# Patient Record
Sex: Female | Born: 1965 | Race: Black or African American | Marital: Single | Smoking: Never smoker
Health system: Northeastern US, Community
[De-identification: ages and names within clinical notes are randomized; demographics above are authoritative.]

## PROBLEM LIST (undated history)

## (undated) DIAGNOSIS — N289 Disorder of kidney and ureter, unspecified: Secondary | ICD-10-CM

## (undated) DIAGNOSIS — G629 Polyneuropathy, unspecified: Secondary | ICD-10-CM

## (undated) DIAGNOSIS — D869 Sarcoidosis, unspecified: Secondary | ICD-10-CM

## (undated) DIAGNOSIS — I10 Essential (primary) hypertension: Secondary | ICD-10-CM

## (undated) DIAGNOSIS — I219 Acute myocardial infarction, unspecified: Secondary | ICD-10-CM

## (undated) DIAGNOSIS — D860 Sarcoidosis of lung: Secondary | ICD-10-CM

## (undated) HISTORY — PX: TUBAL LIGATION: SHX77

## (undated) HISTORY — PX: KNEE SURGERY: SHX244

## (undated) HISTORY — PX: OTHER SURGICAL HISTORY: SHX169

## (undated) HISTORY — PX: CHOLECYSTECTOMY: SHX55

---
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
## 2015-12-06 ENCOUNTER — Emergency Department: Admit: 2015-12-07 | Payer: MEDICARE

## 2015-12-06 DIAGNOSIS — R079 Chest pain, unspecified: Secondary | ICD-10-CM
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
## 2015-12-06 NOTE — ED Triage Notes (Signed)
Pt arrives via EMS for c/o chest pain and sob. States started early this morning. Pt also c/o dizziness and nausea. States right kidney pain and states passed blood clot this morning. Pt received 324 Asprin in prior to ems arrival. Pt with port that she would like to be accessed. States port is for iron infusions.

## 2015-12-07 ENCOUNTER — Inpatient Hospital Stay
Admit: 2015-12-07 | Discharge: 2015-12-07 | Disposition: A | Discharge status: Home / Self Care | Payer: MEDICARE | Attending: Emergency Medicine

## 2015-12-07 LAB — EKG 12-LEAD
Atrial Rate: 78 {beats}/min
Diagnosis: NORMAL
P Axis: 69 degrees
P-R Interval: 188 ms
Q-T Interval: 354 ms
QRS Duration: 98 ms
QTc Calculation (Bazett): 403 ms
R Axis: 40 degrees
T Axis: 5 degrees
Ventricular Rate: 78 {beats}/min

## 2015-12-07 LAB — CBC WITH AUTOMATED DIFF
ABS. BASOPHILS: 0 10*3/uL (ref 0.0–0.2)
ABS. EOSINOPHILS: 0.4 10*3/uL (ref 0.0–0.8)
ABS. IMM. GRANS.: 0 10*3/uL (ref 0.0–0.5)
ABS. LYMPHOCYTES: 4.2 10*3/uL (ref 0.5–4.6)
ABS. MONOCYTES: 0.4 10*3/uL (ref 0.1–1.3)
ABS. NEUTROPHILS: 2.5 10*3/uL (ref 1.7–8.2)
BASOPHILS: 0 % (ref 0.0–2.0)
EOSINOPHILS: 5 % (ref 0.5–7.8)
HCT: 34.7 % — ABNORMAL LOW (ref 35.8–46.3)
HGB: 11.5 g/dL — ABNORMAL LOW (ref 11.7–15.4)
IMMATURE GRANULOCYTES: 0.1 % (ref 0.0–5.0)
LYMPHOCYTES: 56 % — ABNORMAL HIGH (ref 13–44)
MCH: 27.5 PG (ref 26.1–32.9)
MCHC: 33.1 g/dL (ref 31.4–35.0)
MCV: 83 FL (ref 79.6–97.8)
MONOCYTES: 6 % (ref 4.0–12.0)
MPV: 10.5 FL — ABNORMAL LOW (ref 10.8–14.1)
NEUTROPHILS: 33 % — ABNORMAL LOW (ref 43–78)
PLATELET: 192 10*3/uL (ref 150–450)
RBC: 4.18 M/uL (ref 4.05–5.25)
RDW: 14 % (ref 11.9–14.6)
WBC: 7.6 10*3/uL (ref 4.3–11.1)

## 2015-12-07 LAB — EKG, 12 LEAD, INITIAL
Atrial Rate: 78 {beats}/min
Calculated P Axis: 69 degrees
Calculated R Axis: 40 degrees
Calculated T Axis: 5 degrees
Diagnosis: NORMAL
P-R Interval: 188 ms
Q-T Interval: 354 ms
QRS Duration: 98 ms
QTC Calculation (Bezet): 403 ms
Ventricular Rate: 78 {beats}/min

## 2015-12-07 LAB — URINE MICROSCOPIC

## 2015-12-07 LAB — METABOLIC PANEL, COMPREHENSIVE
A-G Ratio: 0.9 — ABNORMAL LOW (ref 1.2–3.5)
ALT (SGPT): 36 U/L (ref 12–65)
AST (SGOT): 26 U/L (ref 15–37)
Albumin: 3.5 g/dL (ref 3.5–5.0)
Alk. phosphatase: 107 U/L (ref 50–136)
Anion gap: 8 mmol/L (ref 7–16)
BUN: 18 MG/DL (ref 6–23)
Bilirubin, total: 0.2 MG/DL (ref 0.2–1.1)
CO2: 26 mmol/L (ref 21–32)
Calcium: 8.9 MG/DL (ref 8.3–10.4)
Chloride: 107 mmol/L (ref 98–107)
Creatinine: 1.4 MG/DL — ABNORMAL HIGH (ref 0.6–1.0)
GFR est AA: 51 mL/min/{1.73_m2} — ABNORMAL LOW (ref >60)
GFR est non-AA: 42 mL/min/{1.73_m2} — ABNORMAL LOW (ref >60)
Globulin: 3.7 g/dL — ABNORMAL HIGH (ref 2.3–3.5)
Glucose: 132 mg/dL — ABNORMAL HIGH (ref 65–100)
Potassium: 3.6 mmol/L (ref 3.5–5.1)
Protein, total: 7.2 g/dL (ref 6.3–8.2)
Sodium: 141 mmol/L (ref 136–145)

## 2015-12-07 LAB — POC TROPONIN: Troponin-I (POC): 0 ng/ml (ref 0.0–0.08)

## 2015-12-07 MED ORDER — HEPARIN, PORCINE (PF) 100 UNIT/ML IV SYRINGE
100 unit/mL | INTRAVENOUS | Status: DC | PRN
Start: 2015-12-07 — End: 2015-12-07
  Administered 2015-12-07: 06:00:00

## 2015-12-07 MED ORDER — ONDANSETRON (PF) 4 MG/2 ML INJECTION
4 mg/2 mL | INTRAMUSCULAR | Status: AC
Start: 2015-12-07 — End: 2015-12-07
  Administered 2015-12-07: 05:00:00 via INTRAVENOUS

## 2015-12-07 MED ORDER — HYDROMORPHONE (PF) 1 MG/ML IJ SOLN
1 mg/mL | INTRAMUSCULAR | Status: AC
Start: 2015-12-07 — End: 2015-12-07
  Administered 2015-12-07: 05:00:00 via INTRAVENOUS

## 2015-12-07 MED FILL — ONDANSETRON (PF) 4 MG/2 ML INJECTION: 4 mg/2 mL | INTRAMUSCULAR | Qty: 2

## 2015-12-07 MED FILL — MONOJECT PREFILL ADVANCED (PF) 100 UNIT/ML INTRAVENOUS SYRINGE: 100 unit/mL | INTRAVENOUS | Qty: 3

## 2015-12-07 MED FILL — HYDROMORPHONE (PF) 1 MG/ML IJ SOLN: 1 mg/mL | INTRAMUSCULAR | Qty: 1

## 2015-12-07 NOTE — ED Notes (Signed)
Pt discharged instructions given pt v\\u to instructions. Pt ambulatory on discharge in no acute distress. Pt getting ride back to shelter

## 2016-08-05 HISTORY — PX: KNEE ARTHROSCOPY W/ MENISCAL REPAIR: SHX1877

## 2016-08-09 ENCOUNTER — Observation Stay
Admission: EM | Admit: 2016-08-09 | Discharge: 2016-08-12 | Disposition: A | Discharge status: Home / Self Care | Payer: Medicare Other | Attending: Internal Medicine | Admitting: Internal Medicine

## 2016-08-09 ENCOUNTER — Emergency Department: Payer: Medicare Other

## 2016-08-09 DIAGNOSIS — R0789 Other chest pain: Secondary | ICD-10-CM | POA: Diagnosis not present

## 2016-08-09 DIAGNOSIS — Z8249 Family history of ischemic heart disease and other diseases of the circulatory system: Secondary | ICD-10-CM | POA: Insufficient documentation

## 2016-08-09 DIAGNOSIS — I252 Old myocardial infarction: Secondary | ICD-10-CM | POA: Diagnosis not present

## 2016-08-09 DIAGNOSIS — Z7982 Long term (current) use of aspirin: Secondary | ICD-10-CM | POA: Diagnosis not present

## 2016-08-09 DIAGNOSIS — I10 Essential (primary) hypertension: Secondary | ICD-10-CM | POA: Insufficient documentation

## 2016-08-09 DIAGNOSIS — R079 Chest pain, unspecified: Secondary | ICD-10-CM | POA: Diagnosis present

## 2016-08-09 DIAGNOSIS — I2510 Atherosclerotic heart disease of native coronary artery without angina pectoris: Secondary | ICD-10-CM | POA: Diagnosis not present

## 2016-08-09 DIAGNOSIS — Z79899 Other long term (current) drug therapy: Secondary | ICD-10-CM | POA: Insufficient documentation

## 2016-08-09 DIAGNOSIS — E785 Hyperlipidemia, unspecified: Secondary | ICD-10-CM | POA: Diagnosis not present

## 2016-08-09 DIAGNOSIS — Z955 Presence of coronary angioplasty implant and graft: Secondary | ICD-10-CM | POA: Diagnosis not present

## 2016-08-09 DIAGNOSIS — D649 Anemia, unspecified: Secondary | ICD-10-CM | POA: Diagnosis not present

## 2016-08-09 DIAGNOSIS — Z885 Allergy status to narcotic agent status: Secondary | ICD-10-CM | POA: Diagnosis not present

## 2016-08-09 DIAGNOSIS — Z79891 Long term (current) use of opiate analgesic: Secondary | ICD-10-CM | POA: Insufficient documentation

## 2016-08-09 HISTORY — DX: Disorder of kidney and ureter, unspecified: N28.9

## 2016-08-09 HISTORY — DX: Acute myocardial infarction, unspecified: I21.9

## 2016-08-09 HISTORY — DX: Polyneuropathy, unspecified: G62.9

## 2016-08-09 HISTORY — DX: Essential (primary) hypertension: I10

## 2016-08-09 HISTORY — DX: Sarcoidosis, unspecified: D86.9

## 2016-08-09 MED ORDER — HYDROMORPHONE HCL 1 MG/ML IJ SOLN
1.0000 mg | Freq: Once | INTRAMUSCULAR | Status: AC
  Administered 2016-08-10: 1 mg via INTRAVENOUS
  Filled 2016-08-09: qty 1

## 2016-08-09 MED ORDER — ONDANSETRON HCL 4 MG/2ML IJ SOLN
4.0000 mg | Freq: Once | INTRAMUSCULAR | Status: AC
  Administered 2016-08-10: 4 mg via INTRAVENOUS
  Filled 2016-08-09: qty 2

## 2016-08-09 NOTE — ED Provider Notes (Signed)
[facility] Emergency Department Provider Note   First MD Initiated Contact with Patient 08/09/16 2256     (approximate)  I have reviewed the triage vital signs and the nursing notes.   HISTORY  Chief Complaint Chest Pain; Dizziness; and Nausea    HPI Melissa Haas is a 51 y.o. female with bolus of chronic medical conditions including myocardial infarction in 2009 [facility], sarcoidosis, stage III renal disease and recent left knee meniscal repair 5 days ago presents to the emergency department with acute onset of left chest pain with radiation to the left neck and arm approximately an hour and a half ago accompanied by shortness of breath. Patient states her current pain score is 9 out of 10. Patient denies any aggravating or alleviating factors. Patient was given 324 mg of aspirin while in route.   Past Medical History:  Diagnosis Date  . Hypertension   . MI (myocardial infarction)   . Neuropathy (HCC)   . Renal disorder   . Sarcoidosis [facility](HCC)     Patient Active Problem List   Diagnosis Date Noted  . Atypical chest pain 08/10/2016    Past Surgical History:  Procedure Laterality Date  . CHOLECYSTECTOMY    . heart stent    . KNEE SURGERY    . TUBAL LIGATION      Prior to Admission medications   Medication Sig Start Date End Date Taking? Authorizing Provider  aspirin EC 81 MG tablet Take 81 mg by mouth daily.   Yes Historical Provider, MD  atorvastatin (LIPITOR) 80 MG tablet Take 80 mg by mouth every evening.   Yes Historical Provider, MD  chlorzoxazone (PARAFON) 500 MG tablet Take 500 mg by mouth 3 (three) times daily as needed for spasms.   Yes Historical Provider, MD  cyanocobalamin (,VITAMIN B-12,) 1000 MCG/ML injection Inject 1,000 mcg into the muscle every 30 (thirty) days.   Yes Historical Provider, MD  Fluocinolone Acetonide Body 0.01 % OIL Apply topically.   Yes Historical Provider, MD  gabapentin (NEURONTIN) 100  MG capsule Take 100 mg by mouth 2 (two) times daily.   Yes Historical Provider, MD  Oxycodone HCl 10 MG TABS Take 10 mg by mouth every 4 (four) hours as needed for pain.   Yes Historical Provider, MD  topiramate (TOPAMAX) 50 MG tablet Take 50 mg by mouth every evening.   Yes Historical Provider, MD    Allergies Flagyl [metronidazole]; Lisinopril; Morphine and related; Toradol [ketorolac tromethamine]; and Vicodin [hydrocodone-acetaminophen]  No family history on file.  Social History Social History  Substance Use Topics  . Smoking status: Never Smoker  . Smokeless tobacco: Never Used  . Alcohol use No    Review of Systems Constitutional: No fever/chills Eyes: No visual changes. ENT: No sore throat. Cardiovascular: Positive for chest pain. Respiratory: Positive for shortness of breath. Gastrointestinal: No abdominal pain.  No nausea, no vomiting.  No diarrhea.  No constipation. Genitourinary: Negative for dysuria. Musculoskeletal: Negative for back pain. Skin: Negative for rash. Neurological: Negative for headaches, focal weakness or numbness.  10-point ROS otherwise negative.  ____________________________________________   PHYSICAL EXAM:  VITAL SIGNS: ED Triage Vitals  Enc Vitals Group     BP 08/09/16 2235 119/75     Pulse Rate 08/09/16 2235 83     Resp 08/09/16 2235 18     Temp 08/09/16 2235 98.3 F (36.8 C)     Temp Source 08/09/16 2235 Oral     SpO2  08/09/16 2235 99 %     Weight 08/09/16 2222 244 lb (110.7 kg)     Height 08/09/16 2222 5\' 10"  (1.778 m)     Head Circumference --      Peak Flow --      Pain Score 08/09/16 2222 8     Pain Loc --      Pain Edu? --      Excl. in GC? --    Constitutional: Alert and oriented. Well appearing and in no acute distress. Eyes: Conjunctivae are normal. PERRL. EOMI. Head: Atraumatic. Nose: No congestion/rhinnorhea. Mouth/Throat: Mucous membranes are moist.  Oropharynx non-erythematous. Neck: No stridor.    Cardiovascular: Normal rate, regular rhythm. Good peripheral circulation. Grossly normal heart sounds. Respiratory: Normal respiratory effort.  No retractions. Lungs CTAB. Gastrointestinal: Soft and nontender. No distention.  Musculoskeletal: No lower extremity tenderness nor edema. No gross deformities of extremities.Left knee incision sites clean dry and intact without any signs of infection. 1+ on pitting edema of the left leg. Neurologic:  Normal speech and language. No gross focal neurologic deficits are appreciated.  Skin:  Skin is warm, dry and intact. No rash noted. Psychiatric: Mood and affect are normal. Speech and behavior are normal.  ____________________________________________   LABS (all labs ordered are listed, but only abnormal results are displayed)  Labs Reviewed  BASIC METABOLIC PANEL - Abnormal; Notable for the following:       Result Value   Glucose, Bld 109 (*)    BUN 21 (*)    Creatinine, Ser 1.18 (*)    GFR calc non Af Amer 53 (*)    All other components within normal limits  CBC - Abnormal; Notable for the following:    Hemoglobin 11.0 (*)    HCT 33.9 (*)    All other components within normal limits  TROPONIN I  HEPATIC FUNCTION PANEL  LIPASE, BLOOD  TROPONIN I   ____________________________________________  EKG  .GGEKG  ____________________________________________  RADIOLOGY I, Darci Current, personally viewed and evaluated these images (plain radiographs) as part of my medical decision making, as well as reviewing the written report by the radiologist.  Dg Chest 2 View  Result Date: 08/09/2016 CLINICAL DATA:  Central left-sided chest pain EXAM: CHEST  2 VIEW COMPARISON:  None. FINDINGS: The heart size and mediastinal contours are within normal limits. Both lungs are clear. No acute nor suspicious osseous abnormalities. There is straightening of thoracic spine. Cholecystectomy clips are seen in the upper abdomen. Port catheter tip terminates  in the distal SVC. IMPRESSION: No active cardiopulmonary disease. Electronically Signed   By: Tollie Eth M.D.   On: 08/09/2016 23:36   Ct Angio Chest Pe W And/or Wo Contrast  Result Date: 08/10/2016 CLINICAL DATA:  Acute onset of central and left-sided chest pain. Vomiting and shortness of breath. Left leg swelling after recent meniscal repair. Initial encounter. EXAM: CT ANGIOGRAPHY CHEST WITH CONTRAST TECHNIQUE: Multidetector CT imaging of the chest was performed using the standard protocol during bolus administration of intravenous contrast. Multiplanar CT image reconstructions and MIPs were obtained to evaluate the vascular anatomy. CONTRAST:  75 mL of Isovue 370 IV contrast COMPARISON:  Chest radiograph performed 08/09/2016 FINDINGS: Cardiovascular:  There is no evidence of pulmonary embolus. The heart is normal in size. The thoracic aorta is unremarkable. No calcific atherosclerotic disease is seen. The great vessels are grossly unremarkable. Mediastinum/Nodes: The mediastinum is unremarkable in appearance. No mediastinal lymphadenopathy is seen. No pericardial effusion is identified. The visualized portions of  the thyroid gland are unremarkable. A right-sided chest port is noted ending about the distal SVC. Lungs/Pleura: Minimal bilateral atelectasis is noted. No pleural effusion or pneumothorax is seen. No masses are identified. Upper Abdomen: The visualized portions of the liver and spleen are unremarkable. The patient is status post cholecystectomy, with clips noted at the gallbladder fossa. The visualized portions of the pancreas, adrenal glands and kidneys are within normal limits. Musculoskeletal: No acute osseous abnormalities are identified. The visualized musculature is unremarkable in appearance. Review of the MIP images confirms the above findings. IMPRESSION: 1. No evidence of pulmonary embolus. 2. Minimal bilateral atelectasis noted.  Lungs otherwise clear. Electronically Signed   By:  Roanna Raider M.D.   On: 08/10/2016 02:39     Procedures   ____________________________________________   INITIAL IMPRESSION / ASSESSMENT AND PLAN / ED COURSE  Pertinent labs & imaging results that were available during my care of the patient were reviewed by me and considered in my medical decision making (see chart for details).  Given history of physical exam concern for possible cardiac etiology versus pulmonary emboli. CT scan chest PE protocol revealed no evidence of pulmonary emboli. Regarding possible cardiac etiology troponin negative 1 EKG revealed no evidence of ischemia or infarction. Patient discussed with Dr. Frederica Kuster admission for further evaluation and management.      ____________________________________________  FINAL CLINICAL IMPRESSION(S) / ED DIAGNOSES  Final diagnoses:  Chest pain, unspecified type     MEDICATIONS GIVEN DURING THIS VISIT:  Medications  HYDROmorphone (DILAUDID) injection 1 mg (1 mg Intravenous Given 08/10/16 0006)  ondansetron (ZOFRAN) injection 4 mg (4 mg Intravenous Given 08/10/16 0003)  sodium chloride 0.9 % bolus 1,000 mL (1,000 mLs Intravenous New Bag/Given 08/10/16 0234)  iopamidol (ISOVUE-370) 76 % injection 75 mL (75 mLs Intravenous Contrast Given 08/10/16 0214)     NEW OUTPATIENT MEDICATIONS STARTED DURING THIS VISIT:  New Prescriptions   No medications on file    Modified Medications   No medications on file    Discontinued Medications   ASPIRIN EC 325 MG TABLET    Take 325 mg by mouth daily.   HYDROCODONE-ACETAMINOPHEN (NORCO/VICODIN) 5-325 MG TABLET    Take 1-2 tablets by mouth every 4 (four) hours as needed for painful procedure/intervention.   PROMETHAZINE (PHENERGAN) 25 MG TABLET    Take 25 mg by mouth every 6 (six) hours as needed for nausea.     Note:  This document was prepared using Dragon voice recognition software and may include unintentional dictation errors.    Darci Current, MD 08/10/16  [phone]

## 2016-08-09 NOTE — ED Triage Notes (Signed)
Pt reports to ED via [facility] with c/o central and left-sided chest pain that started 1 1/2 hrs PTA. Pt reports x1 emesis, (+) SHOB. Pt with significant cardiac h/x, including MI and stent placement. EMS reports giving pt 4 aspirin tablets in route. Pt denies radiation of pain into neck, back or jaw, but states her neck feels "tingly".

## 2016-08-09 NOTE — ED Notes (Signed)
Pt requests to not have blood drawn in Triage; states she has a port-a-cath and would rather that be accessed for lab testing.

## 2016-08-10 ENCOUNTER — Emergency Department: Payer: Medicare Other

## 2016-08-10 ENCOUNTER — Encounter: Payer: Self-pay | Admitting: Radiology

## 2016-08-10 DIAGNOSIS — R0789 Other chest pain: Secondary | ICD-10-CM | POA: Diagnosis present

## 2016-08-10 LAB — CBC
HCT: 33.9 % — ABNORMAL LOW (ref 35.0–47.0)
Hemoglobin: 11 g/dL — ABNORMAL LOW (ref 12.0–16.0)
MCH: 27 pg (ref 26.0–34.0)
MCHC: 32.6 g/dL (ref 32.0–36.0)
MCV: 82.9 fL (ref 80.0–100.0)
PLATELETS: 178 10*3/uL (ref 150–440)
RBC: 4.09 MIL/uL (ref 3.80–5.20)
RDW: 14.3 % (ref 11.5–14.5)
WBC: 7.6 10*3/uL (ref 3.6–11.0)

## 2016-08-10 LAB — BASIC METABOLIC PANEL
ANION GAP: 8 (ref 5–15)
BUN: 21 mg/dL — ABNORMAL HIGH (ref 6–20)
CALCIUM: 9 mg/dL (ref 8.9–10.3)
CO2: 24 mmol/L (ref 22–32)
CREATININE: 1.18 mg/dL — AB (ref 0.44–1.00)
Chloride: 108 mmol/L (ref 101–111)
GFR calc non Af Amer: 53 mL/min — ABNORMAL LOW (ref >60)
Glucose, Bld: 109 mg/dL — ABNORMAL HIGH (ref 65–99)
Potassium: 3.5 mmol/L (ref 3.5–5.1)
Sodium: 140 mmol/L (ref 135–145)

## 2016-08-10 LAB — TROPONIN I: Troponin I: <0.03 ng/mL (ref <0.03)

## 2016-08-10 LAB — HEPATIC FUNCTION PANEL
ALBUMIN: 3.8 g/dL (ref 3.5–5.0)
ALT: 27 U/L (ref 14–54)
AST: 30 U/L (ref 15–41)
Alkaline Phosphatase: 72 U/L (ref 38–126)
Bilirubin, Direct: 0.1 mg/dL (ref 0.1–0.5)
Indirect Bilirubin: 0.4 mg/dL (ref 0.3–0.9)
TOTAL PROTEIN: 6.9 g/dL (ref 6.5–8.1)
Total Bilirubin: 0.5 mg/dL (ref 0.3–1.2)

## 2016-08-10 LAB — LIPASE, BLOOD: Lipase: 22 U/L (ref 11–51)

## 2016-08-10 LAB — TSH: TSH: 1.96 u[IU]/mL (ref 0.350–4.500)

## 2016-08-10 MED ORDER — SODIUM CHLORIDE 0.9 % IV SOLN
INTRAVENOUS | Status: DC
  Administered 2016-08-10 – 2016-08-11 (×3): via INTRAVENOUS

## 2016-08-10 MED ORDER — CYANOCOBALAMIN 1000 MCG/ML IJ SOLN
1000.0000 ug | INTRAMUSCULAR | Status: DC
  Filled 2016-08-10: qty 1

## 2016-08-10 MED ORDER — CARVEDILOL 3.125 MG PO TABS
3.1250 mg | ORAL_TABLET | Freq: Two times a day (BID) | ORAL | Status: DC
  Administered 2016-08-10 – 2016-08-12 (×3): 3.125 mg via ORAL
  Filled 2016-08-10 (×2): qty 1

## 2016-08-10 MED ORDER — ASPIRIN EC 81 MG PO TBEC
81.0000 mg | DELAYED_RELEASE_TABLET | Freq: Every day | ORAL | Status: DC
  Administered 2016-08-10 – 2016-08-12 (×3): 81 mg via ORAL
  Filled 2016-08-10 (×3): qty 1

## 2016-08-10 MED ORDER — SODIUM CHLORIDE 0.9% FLUSH
10.0000 mL | Freq: Two times a day (BID) | INTRAVENOUS | Status: DC
  Administered 2016-08-10 – 2016-08-12 (×4): 10 mL

## 2016-08-10 MED ORDER — ONDANSETRON HCL 4 MG PO TABS: 4.0000 mg | ORAL_TABLET | Freq: Four times a day (QID) | ORAL | Status: DC | PRN

## 2016-08-10 MED ORDER — ONDANSETRON HCL 4 MG/2ML IJ SOLN
4.0000 mg | Freq: Four times a day (QID) | INTRAMUSCULAR | Status: DC | PRN
  Administered 2016-08-11 – 2016-08-12 (×2): 4 mg via INTRAVENOUS
  Filled 2016-08-10 (×2): qty 2

## 2016-08-10 MED ORDER — SODIUM CHLORIDE 0.9% FLUSH
3.0000 mL | Freq: Two times a day (BID) | INTRAVENOUS | Status: DC
  Administered 2016-08-10 – 2016-08-12 (×2): 3 mL via INTRAVENOUS

## 2016-08-10 MED ORDER — DOCUSATE SODIUM 100 MG PO CAPS
100.0000 mg | ORAL_CAPSULE | Freq: Two times a day (BID) | ORAL | Status: DC
  Administered 2016-08-10 – 2016-08-12 (×5): 100 mg via ORAL
  Filled 2016-08-10 (×5): qty 1

## 2016-08-10 MED ORDER — SODIUM CHLORIDE 0.9 % IV BOLUS (SEPSIS)
1000.0000 mL | Freq: Once | INTRAVENOUS | Status: AC
  Administered 2016-08-10: 1000 mL via INTRAVENOUS

## 2016-08-10 MED ORDER — ATORVASTATIN CALCIUM 20 MG PO TABS
80.0000 mg | ORAL_TABLET | Freq: Every evening | ORAL | Status: DC
  Administered 2016-08-10 – 2016-08-11 (×2): 80 mg via ORAL
  Filled 2016-08-10 (×2): qty 4

## 2016-08-10 MED ORDER — ENOXAPARIN SODIUM 40 MG/0.4ML ~~LOC~~ SOLN
40.0000 mg | SUBCUTANEOUS | Status: DC
  Administered 2016-08-10 – 2016-08-12 (×2): 40 mg via SUBCUTANEOUS
  Filled 2016-08-10 (×2): qty 0.4

## 2016-08-10 MED ORDER — ACETAMINOPHEN 650 MG RE SUPP: 650.0000 mg | Freq: Four times a day (QID) | RECTAL | Status: DC | PRN

## 2016-08-10 MED ORDER — TOPIRAMATE 25 MG PO TABS
50.0000 mg | ORAL_TABLET | Freq: Every evening | ORAL | Status: DC
Start: 2016-08-10 — End: 2016-08-12
  Administered 2016-08-10 – 2016-08-11 (×2): 50 mg via ORAL
  Filled 2016-08-10 (×3): qty 2

## 2016-08-10 MED ORDER — IOPAMIDOL (ISOVUE-370) INJECTION 76%
75.0000 mL | Freq: Once | INTRAVENOUS | Status: AC | PRN
  Administered 2016-08-10: 75 mL via INTRAVENOUS

## 2016-08-10 MED ORDER — SODIUM CHLORIDE 0.9% FLUSH: 10.0000 mL | INTRAVENOUS | Status: DC | PRN

## 2016-08-10 MED ORDER — GABAPENTIN 100 MG PO CAPS
100.0000 mg | ORAL_CAPSULE | Freq: Two times a day (BID) | ORAL | Status: DC
  Administered 2016-08-10 – 2016-08-12 (×5): 100 mg via ORAL
  Filled 2016-08-10 (×5): qty 1

## 2016-08-10 MED ORDER — ACETAMINOPHEN 325 MG PO TABS
650.0000 mg | ORAL_TABLET | Freq: Four times a day (QID) | ORAL | Status: DC | PRN
  Administered 2016-08-10: 650 mg via ORAL
  Filled 2016-08-10: qty 2

## 2016-08-10 NOTE — Plan of Care (Signed)
Problem: Pain Managment: Goal: General experience of comfort will improve Outcome: Progressing Complains of pain in knee. Recent surgery

## 2016-08-10 NOTE — Progress Notes (Signed)
HIV and troponin sent to lab. Consent signed for HIV test.

## 2016-08-10 NOTE — ED Notes (Signed)
Pt given graham crackers and ginger ale per EDP. Will continue to monitor pt.

## 2016-08-10 NOTE — Plan of Care (Signed)
Problem: Activity: Goal: Ability to tolerate increased activity will improve Outcome: Not Progressing Recent knee surgery (around 5 days ago).

## 2016-08-10 NOTE — Progress Notes (Signed)
Declined bed alarm. Patient is independent and ambulating well. Has had falls at home so she scores as a high fall risk. Patient agrees to call for help if she feels dizzy or needs help.

## 2016-08-10 NOTE — ED Notes (Signed)
Called lab about label printer not letting this RN or charge print labels. Gave the ok to place white patient label with collect time on vial.

## 2016-08-10 NOTE — Care Management Obs Status (Signed)
MEDICARE OBSERVATION STATUS NOTIFICATION   Patient Details  Name: Melissa Haas MRN: [id number] Date of Birth: 20-Aug-1965   Medicare Observation Status Notification Given:  Yes    Eber HongGreene, Dellia Donnelly R, RN 08/10/2016, 11:08 AM

## 2016-08-10 NOTE — Progress Notes (Signed)
A&O. Up with one assist. Rates pain in legs at a 9 and in chest at a 5, however patient falls asleep easily during conversation. Does not appear to be in any distress. On RA. VSS. IVF infusing via PAC. Will continue to monitor.

## 2016-08-10 NOTE — ED Notes (Signed)
Dr. Sheryle Hailiamond with pt in rm.

## 2016-08-10 NOTE — Progress Notes (Signed)
[facility] [place] [facility]   PATIENT NAME: Melissa LoboCarman Haas    MR#:  [id number]  DATE OF BIRTH:  03/06/1966  SUBJECTIVE:  CHIEF COMPLAINT:  Patient is feeling weak and tired reporting left left-sided chest pain. Denies any shortness of breath is resting comfortably, has Port-A-Cath for IV iron transfusions  REVIEW OF SYSTEMS:  CONSTITUTIONAL: No fever, fatigue or weakness.  EYES: No blurred or double vision.  EARS, NOSE, AND THROAT: No tinnitus or ear pain.  RESPIRATORY: No cough, shortness of breath, wheezing or hemoptysis.  CARDIOVASCULAR: Reporting left-sided chest pain, denies orthopnea, edema.  GASTROINTESTINAL: No nausea, vomiting, diarrhea or abdominal pain.  GENITOURINARY: No dysuria, hematuria.  ENDOCRINE: No polyuria, nocturia,  HEMATOLOGY: No anemia, easy bruising or bleeding SKIN: No rash or lesion. MUSCULOSKELETAL: No joint pain or arthritis.   NEUROLOGIC: No tingling, numbness, weakness.  PSYCHIATRY: No anxiety or depression.   DRUG ALLERGIES:   Allergies  Allergen Reactions  . Flagyl [Metronidazole] Anaphylaxis  . Lisinopril Cough  . Morphine And Related Other (See Comments)    Causes me "chest pain"  . Toradol [Ketorolac Tromethamine] Other (See Comments)    Involuntary muscle movements  . Vicodin [Hydrocodone-Acetaminophen] Nausea And Vomiting    VITALS:  Blood pressure 121/78, pulse 79, temperature 97.7 F (36.5 C), temperature source Oral, resp. rate 19, height 5\' 10"  (1.778 m), weight 110.7 kg (244 lb), SpO2 96 %.  PHYSICAL EXAMINATION:  GENERAL:  51 y.o.-year-old patient lying in the bed with no acute distress.  EYES: Pupils equal, round, reactive to light and accommodation. No scleral icterus. Extraocular muscles intact.  HEENT: Head atraumatic, normocephalic. Oropharynx and nasopharynx clear.  NECK:  Supple, no jugular venous distention. No thyroid enlargement, no tenderness.  LUNGS: Normal breath sounds bilaterally,  no wheezing, rales,rhonchi or crepitation. No use of accessory muscles of respiration.  CARDIOVASCULAR: S1, S2 normal. No murmurs, rubs, or gallops. Reproducible left-sided chest wall tenderness Port-A-Cath site is intact ABDOMEN: Soft, nontender, nondistended. Bowel sounds present. No organomegaly or mass.  EXTREMITIES: No pedal edema, cyanosis, or clubbing.  NEUROLOGIC: Cranial nerves II through XII are intact. Muscle strength 5/5 in all extremities. Sensation intact. Gait not checked.  PSYCHIATRIC: The patient is alert and oriented x 3.  SKIN: No obvious rash, lesion, or ulcer.    LABORATORY PANEL:   CBC  Recent Labs Lab 08/09/16 2350  WBC 7.6  HGB 11.0*  HCT 33.9*  PLT 178   ------------------------------------------------------------------------------------------------------------------  Chemistries   Recent Labs Lab 08/09/16 2350  NA 140  K 3.5  CL 108  CO2 24  GLUCOSE 109*  BUN 21*  CREATININE 1.18*  CALCIUM 9.0  AST 30  ALT 27  ALKPHOS 72  BILITOT 0.5   ------------------------------------------------------------------------------------------------------------------  Cardiac Enzymes  Recent Labs Lab 08/10/16 0652  TROPONINI <0.03   ------------------------------------------------------------------------------------------------------------------  RADIOLOGY:  Dg Chest 2 View  Result Date: 08/09/2016 CLINICAL DATA:  Central left-sided chest pain EXAM: CHEST  2 VIEW COMPARISON:  None. FINDINGS: The heart size and mediastinal contours are within normal limits. Both lungs are clear. No acute nor suspicious osseous abnormalities. There is straightening of thoracic spine. Cholecystectomy clips are seen in the upper abdomen. Port catheter tip terminates in the distal SVC. IMPRESSION: No active cardiopulmonary disease. Electronically Signed   By: Tollie Ethavid  Kwon M.D.   On: 08/09/2016 23:36   Ct Angio Chest Pe W And/or Wo Contrast  Result Date: 08/10/2016 CLINICAL  DATA:  Acute onset of central and left-sided chest  pain. Vomiting and shortness of breath. Left leg swelling after recent meniscal repair. Initial encounter. EXAM: CT ANGIOGRAPHY CHEST WITH CONTRAST TECHNIQUE: Multidetector CT imaging of the chest was performed using the standard protocol during bolus administration of intravenous contrast. Multiplanar CT image reconstructions and MIPs were obtained to evaluate the vascular anatomy. CONTRAST:  75 mL of Isovue 370 IV contrast COMPARISON:  Chest radiograph performed 08/09/2016 FINDINGS: Cardiovascular:  There is no evidence of pulmonary embolus. The heart is normal in size. The thoracic aorta is unremarkable. No calcific atherosclerotic disease is seen. The great vessels are grossly unremarkable. Mediastinum/Nodes: The mediastinum is unremarkable in appearance. No mediastinal lymphadenopathy is seen. No pericardial effusion is identified. The visualized portions of the thyroid gland are unremarkable. A right-sided chest port is noted ending about the distal SVC. Lungs/Pleura: Minimal bilateral atelectasis is noted. No pleural effusion or pneumothorax is seen. No masses are identified. Upper Abdomen: The visualized portions of the liver and spleen are unremarkable. The patient is status post cholecystectomy, with clips noted at the gallbladder fossa. The visualized portions of the pancreas, adrenal glands and kidneys are within normal limits. Musculoskeletal: No acute osseous abnormalities are identified. The visualized musculature is unremarkable in appearance. Review of the MIP images confirms the above findings. IMPRESSION: 1. No evidence of pulmonary embolus. 2. Minimal bilateral atelectasis noted.  Lungs otherwise clear. Electronically Signed   By: Roanna Raider M.D.   On: 08/10/2016 02:39    EKG:   Orders placed or performed during the hospital encounter of 08/09/16  . ED EKG within 10 minutes  . ED EKG within 10 minutes  . EKG 12-Lead  . EKG 12-Lead     ASSESSMENT AND PLAN:   This is a 51 year old female admitted for chest pain.  1. Chest pain: Atypical Acute MI ruled out with negative troponins CT and a gram of the chest is negative for pulmonary embolism Cardiology is recommending cardiac catheterization tomorrow nothing by mouth after midnight  EKG shows downgoing ST segments and inferior leads. Follow-up with [facility] cardiology Dr. Juliann Pares Continue home medication Coreg and hold off on losartan as blood pressure is soft  2. Coronary artery disease: Continue aspirin. Continue Coreg holding losartan  3. Anemia: The patient receives regular IV iron infusions. Hemoglobin is acceptable at this time and setting of coronary artery disease.  4. Hyperlipidemia: Continue statin therapy  5. DVT prophylaxis: Lovenox  6. GI prophylaxis: None     All the records are reviewed and case discussed with Care Management/Social Workerr. Management plans discussed with the patient, family and they are in agreement.  CODE STATUS:   TOTAL TIME TAKING CARE OF THIS PATIENT: 35 minutes.   POSSIBLE D/C IN AM  DAYS, DEPENDING ON CLINICAL CONDITION.  Note: This dictation was prepared with Dragon dictation along with smaller phrase technology. Any transcriptional errors that result from this process are unintentional.   Ramonita Lab M.D on 08/10/2016 at 3:07 PM  Between 7am to 6pm - Pager - [phone] After 6pm go to [email or web address] - password [email or web address] [facility]  [facility]  Office  [phone]  CC: Primary care physician; Danae Chen, MD

## 2016-08-10 NOTE — ED Notes (Addendum)
Pt reports pain in chest has decreased since medication adm (see MAR), pt is resting and has been sleeping when rounding on pt. Pt in NAD at this time.

## 2016-08-10 NOTE — ED Notes (Signed)
Lab contacted about troponin not being resulted. Lab stated it would be 20 minutes until it was ready.

## 2016-08-10 NOTE — H&P (Signed)
Melissa Haas is an 51 y.o. female.   Chief Complaint: Chest pain HPI: The patient with past medical history of coronary artery disease status post MI with stent placement, hypertension and sarcoidosis presents to the emergency department complaining of chest pain. The pain began at rest and was like pressure in character over her left chest. It radiated into her neck and down her left arm. The patient complains of her left hand becoming tingly. She had associated dizziness as well as nausea and vomiting. Emesis was nonbloody and nonbilious. She received aspirin in route in her chest pain resolved. Notably, the patient underwent repair of her left knee meniscus 5 days ago. CTA of her chest in the emergency department was negative for pulmonary embolism. Two sets of troponins were negative as well but due to her risk factors emergency department staff called the hospitalist service for admission  Past Medical History:  Diagnosis Date  . Hypertension   . MI (myocardial infarction)   . Neuropathy (HCC)   . Renal disorder   . Sarcoidosis (HCC)     Past Surgical History:  Procedure Laterality Date  . CHOLECYSTECTOMY    . heart stent    . KNEE ARTHROSCOPY W/ MENISCAL REPAIR Left 08/05/2016  . KNEE SURGERY    . TUBAL LIGATION      Family History  Problem Relation Age of Onset  . Diabetes Mellitus II Mother   . CAD Sister     d. 54 yrs old  . Diabetes Mellitus II Sister    Social History:  reports that she has never smoked. She has never used smokeless tobacco. She reports that she does not drink alcohol or use drugs.  Allergies:  Allergies  Allergen Reactions  . Flagyl [Metronidazole] Anaphylaxis  . Lisinopril Cough  . Morphine And Related Other (See Comments)    Causes me "chest pain"  . Toradol [Ketorolac Tromethamine] Other (See Comments)    Involuntary muscle movements  . Vicodin [Hydrocodone-Acetaminophen] Nausea And Vomiting    Medications Prior to Admission  Medication Sig  Dispense Refill  . aspirin EC 81 MG tablet Take 81 mg by mouth daily.    . atorvastatin (LIPITOR) 80 MG tablet Take 80 mg by mouth every evening.    . chlorzoxazone (PARAFON) 500 MG tablet Take 500 mg by mouth 3 (three) times daily as needed for spasms.    . cyanocobalamin (,VITAMIN B-12,) 1000 MCG/ML injection Inject 1,000 mcg into the muscle every 30 (thirty) days.    . Fluocinolone Acetonide Body 0.01 % OIL Apply topically.    . gabapentin (NEURONTIN) 100 MG capsule Take 100 mg by mouth 2 (two) times daily.    . Oxycodone HCl 10 MG TABS Take 10 mg by mouth every 4 (four) hours as needed for pain.    . topiramate (TOPAMAX) 50 MG tablet Take 50 mg by mouth every evening.      Results for orders placed or performed during the hospital encounter of 08/09/16 (from the past 48 hour(s))  Basic metabolic panel     Status: Abnormal   Collection Time: 08/09/16 11:50 PM  Result Value Ref Range   Sodium 140 135 - 145 mmol/L   Potassium 3.5 3.5 - 5.1 mmol/L   Chloride 108 101 - 111 mmol/L   CO2 24 22 - 32 mmol/L   Glucose, Bld 109 (H) 65 - 99 mg/dL   BUN 21 (H) 6 - 20 mg/dL   Creatinine, Ser 1.18 (H) 0.44 - 1.00 mg/dL     Calcium 9.0 8.9 - 10.3 mg/dL   GFR calc non Af Amer 53 (L) >60 mL/min   GFR calc Af Amer >60 >60 mL/min    Comment: (NOTE) The eGFR has been calculated using the CKD EPI equation. This calculation has not been validated in all clinical situations. eGFR's persistently <60 mL/min signify possible Chronic Kidney Disease.    Anion gap 8 5 - 15  CBC     Status: Abnormal   Collection Time: 08/09/16 11:50 PM  Result Value Ref Range   WBC 7.6 3.6 - 11.0 K/uL   RBC 4.09 3.80 - 5.20 MIL/uL   Hemoglobin 11.0 (L) 12.0 - 16.0 g/dL   HCT 33.9 (L) 35.0 - 47.0 %   MCV 82.9 80.0 - 100.0 fL   MCH 27.0 26.0 - 34.0 pg   MCHC 32.6 32.0 - 36.0 g/dL   RDW 14.3 11.5 - 14.5 %   Platelets 178 150 - 440 K/uL  Troponin I     Status: None   Collection Time: 08/09/16 11:50 PM  Result Value Ref  Range   Troponin I <0.03 <0.03 ng/mL  Hepatic function panel     Status: None   Collection Time: 08/09/16 11:50 PM  Result Value Ref Range   Total Protein 6.9 6.5 - 8.1 g/dL   Albumin 3.8 3.5 - 5.0 g/dL   AST 30 15 - 41 U/L   ALT 27 14 - 54 U/L   Alkaline Phosphatase 72 38 - 126 U/L   Total Bilirubin 0.5 0.3 - 1.2 mg/dL   Bilirubin, Direct 0.1 0.1 - 0.5 mg/dL   Indirect Bilirubin 0.4 0.3 - 0.9 mg/dL  Lipase, blood     Status: None   Collection Time: 08/09/16 11:50 PM  Result Value Ref Range   Lipase 22 11 - 51 U/L  Troponin I     Status: None   Collection Time: 08/10/16  3:47 AM  Result Value Ref Range   Troponin I <0.03 <0.03 ng/mL   Dg Chest 2 View  Result Date: 08/09/2016 CLINICAL DATA:  Central left-sided chest pain EXAM: CHEST  2 VIEW COMPARISON:  None. FINDINGS: The heart size and mediastinal contours are within normal limits. Both lungs are clear. No acute nor suspicious osseous abnormalities. There is straightening of thoracic spine. Cholecystectomy clips are seen in the upper abdomen. Port catheter tip terminates in the distal SVC. IMPRESSION: No active cardiopulmonary disease. Electronically Signed   By: Ashley Royalty M.D.   On: 08/09/2016 23:36   Ct Angio Chest Pe W And/or Wo Contrast  Result Date: 08/10/2016 CLINICAL DATA:  Acute onset of central and left-sided chest pain. Vomiting and shortness of breath. Left leg swelling after recent meniscal repair. Initial encounter. EXAM: CT ANGIOGRAPHY CHEST WITH CONTRAST TECHNIQUE: Multidetector CT imaging of the chest was performed using the standard protocol during bolus administration of intravenous contrast. Multiplanar CT image reconstructions and MIPs were obtained to evaluate the vascular anatomy. CONTRAST:  75 mL of Isovue 370 IV contrast COMPARISON:  Chest radiograph performed 08/09/2016 FINDINGS: Cardiovascular:  There is no evidence of pulmonary embolus. The heart is normal in size. The thoracic aorta is unremarkable. No  calcific atherosclerotic disease is seen. The great vessels are grossly unremarkable. Mediastinum/Nodes: The mediastinum is unremarkable in appearance. No mediastinal lymphadenopathy is seen. No pericardial effusion is identified. The visualized portions of the thyroid gland are unremarkable. A right-sided chest port is noted ending about the distal SVC. Lungs/Pleura: Minimal bilateral atelectasis is noted. No pleural effusion  or pneumothorax is seen. No masses are identified. Upper Abdomen: The visualized portions of the liver and spleen are unremarkable. The patient is status post cholecystectomy, with clips noted at the gallbladder fossa. The visualized portions of the pancreas, adrenal glands and kidneys are within normal limits. Musculoskeletal: No acute osseous abnormalities are identified. The visualized musculature is unremarkable in appearance. Review of the MIP images confirms the above findings. IMPRESSION: 1. No evidence of pulmonary embolus. 2. Minimal bilateral atelectasis noted.  Lungs otherwise clear. Electronically Signed   By: Jeffery  Chang M.D.   On: 08/10/2016 02:39    Review of Systems  Constitutional: Negative for chills and fever.  HENT: Negative for sore throat and tinnitus.   Eyes: Negative for blurred vision and redness.  Respiratory: Positive for shortness of breath. Negative for cough.   Cardiovascular: Positive for chest pain. Negative for palpitations, orthopnea and PND.  Gastrointestinal: Positive for nausea and vomiting. Negative for abdominal pain and diarrhea.  Genitourinary: Negative for dysuria, frequency and urgency.  Musculoskeletal: Negative for joint pain and myalgias.  Skin: Negative for rash.       No lesions  Neurological: Positive for speech change. Negative for focal weakness and weakness.  Endo/Heme/Allergies: Does not bruise/bleed easily.       No temperature intolerance  Psychiatric/Behavioral: Negative for depression and suicidal ideas.    Blood  pressure 118/70, pulse 79, temperature 97.7 F (36.5 C), temperature source Oral, resp. rate 14, height 5' 10" (1.778 m), weight 110.7 kg (244 lb), SpO2 96 %. Physical Exam  Vitals reviewed. Constitutional: She is oriented to person, place, and time. She appears well-developed and well-nourished. No distress.  HENT:  Head: Normocephalic and atraumatic.  Mouth/Throat: Oropharynx is clear and moist.  Eyes: Conjunctivae and EOM are normal. Pupils are equal, round, and reactive to light. No scleral icterus.  Neck: Normal range of motion. Neck supple. No JVD present. No tracheal deviation present. No thyromegaly present.  Cardiovascular: Normal rate, regular rhythm and normal heart sounds.  Exam reveals no gallop and no friction rub.   No murmur heard. Respiratory: Effort normal and breath sounds normal.  Port accessed and place right chest wall  GI: Soft. Bowel sounds are normal. She exhibits no distension. There is no tenderness.  Genitourinary:  Genitourinary Comments: Deferred  Musculoskeletal: Normal range of motion. She exhibits edema (Trace pretibial; left more than right).  Left knee range of motion limited by pain. Sutures from arthroscopic incisions in place  Lymphadenopathy:    She has no cervical adenopathy.  Neurological: She is alert and oriented to person, place, and time. No cranial nerve deficit. She exhibits normal muscle tone.  Skin: Skin is warm and dry. No rash noted. No erythema.  Psychiatric: She has a normal mood and affect. Her behavior is normal. Judgment and thought content normal.     Assessment/Plan This is a 51-year-old female admitted for chest pain. 1. Chest pain: Currently resolved. Continue to follow cardiac enzymes and monitor telemetry. EKG shows downgoing ST segments and inferior leads. The patient does not have other EKGs for comparison. Consult cardiology. Start heparin if increase in troponin. 2. Coronary artery disease: Continue aspirin 3. Anemia: The  patient receives regular IV iron infusions. Hemoglobin is acceptable at this time and setting of coronary artery disease. 4. Hyperlipidemia: Continue statin therapy 5. DVT prophylaxis: Lovenox 6. GI prophylaxis: None The patient is a full code. Time spent on admission orders and patient care approximately 45 minutes  Diamond,  Michael S,   MD 08/10/2016, 7:31 AM

## 2016-08-11 ENCOUNTER — Encounter
Admission: EM | Disposition: A | Discharge status: Home / Self Care | Payer: Self-pay | Source: Home / Self Care | Attending: Emergency Medicine

## 2016-08-11 HISTORY — PX: LEFT HEART CATH AND CORONARY ANGIOGRAPHY: CATH118249

## 2016-08-11 LAB — HEMOGLOBIN A1C
Hgb A1c MFr Bld: 6.1 % — ABNORMAL HIGH (ref 4.8–5.6)
Mean Plasma Glucose: 128 mg/dL

## 2016-08-11 LAB — HIV ANTIBODY (ROUTINE TESTING W REFLEX): HIV Screen 4th Generation wRfx: NONREACTIVE

## 2016-08-11 LAB — PROTIME-INR
INR: 1.06
Prothrombin Time: 13.8 seconds (ref 11.4–15.2)

## 2016-08-11 LAB — CARDIAC CATHETERIZATION: CATHEFQUANT: 55 %

## 2016-08-11 SURGERY — LEFT HEART CATH AND CORONARY ANGIOGRAPHY
Anesthesia: Moderate Sedation

## 2016-08-11 MED ORDER — SODIUM CHLORIDE 0.9% FLUSH: 3.0000 mL | INTRAVENOUS | Status: DC | PRN

## 2016-08-11 MED ORDER — MIDAZOLAM HCL 2 MG/2ML IJ SOLN
INTRAMUSCULAR | Status: DC | PRN
  Administered 2016-08-11 (×2): 1 mg via INTRAVENOUS

## 2016-08-11 MED ORDER — SODIUM CHLORIDE 0.9 % IV SOLN: 250.0000 mL | INTRAVENOUS | Status: DC | PRN

## 2016-08-11 MED ORDER — SODIUM CHLORIDE 0.9% FLUSH
3.0000 mL | Freq: Two times a day (BID) | INTRAVENOUS | Status: DC
  Administered 2016-08-11: 3 mL via INTRAVENOUS

## 2016-08-11 MED ORDER — ASPIRIN 81 MG PO CHEW
CHEWABLE_TABLET | ORAL | Status: AC
  Filled 2016-08-11: qty 1

## 2016-08-11 MED ORDER — LIDOCAINE HCL (PF) 1 % IJ SOLN
INTRAMUSCULAR | Status: DC | PRN
  Administered 2016-08-11: 20 mL via SUBCUTANEOUS

## 2016-08-11 MED ORDER — ACETAMINOPHEN 325 MG PO TABS: 650.0000 mg | ORAL_TABLET | ORAL | Status: DC | PRN

## 2016-08-11 MED ORDER — IOPAMIDOL (ISOVUE-300) INJECTION 61%
INTRAVENOUS | Status: DC | PRN
  Administered 2016-08-11: 100 mL via INTRA_ARTERIAL

## 2016-08-11 MED ORDER — SODIUM CHLORIDE 0.9 % WEIGHT BASED INFUSION: 1.0000 mL/kg/h | INTRAVENOUS | Status: DC

## 2016-08-11 MED ORDER — ONDANSETRON HCL 4 MG/2ML IJ SOLN: 4.0000 mg | Freq: Four times a day (QID) | INTRAMUSCULAR | Status: DC | PRN

## 2016-08-11 MED ORDER — OXYCODONE HCL 5 MG PO TABS
10.0000 mg | ORAL_TABLET | Freq: Four times a day (QID) | ORAL | Status: DC | PRN
  Administered 2016-08-11 – 2016-08-12 (×2): 10 mg via ORAL
  Filled 2016-08-11 (×2): qty 2

## 2016-08-11 MED ORDER — HEPARIN (PORCINE) IN NACL 2-0.9 UNIT/ML-% IJ SOLN
INTRAMUSCULAR | Status: AC
  Filled 2016-08-11: qty 500

## 2016-08-11 MED ORDER — ONDANSETRON HCL 4 MG/2ML IJ SOLN: 4.0000 mg | INTRAMUSCULAR | Status: DC | PRN

## 2016-08-11 MED ORDER — FENTANYL CITRATE (PF) 100 MCG/2ML IJ SOLN
INTRAMUSCULAR | Status: AC
  Filled 2016-08-11: qty 2

## 2016-08-11 MED ORDER — ONDANSETRON HCL 4 MG/2ML IJ SOLN
INTRAMUSCULAR | Status: AC
  Administered 2016-08-11: 4 mg via INTRAVENOUS
  Filled 2016-08-11: qty 2

## 2016-08-11 MED ORDER — FENTANYL CITRATE (PF) 100 MCG/2ML IJ SOLN
INTRAMUSCULAR | Status: DC | PRN
  Administered 2016-08-11 (×2): 25 ug via INTRAVENOUS

## 2016-08-11 MED ORDER — LIDOCAINE HCL (PF) 1 % IJ SOLN
INTRAMUSCULAR | Status: AC
  Filled 2016-08-11: qty 30

## 2016-08-11 MED ORDER — ASPIRIN 81 MG PO CHEW
81.0000 mg | CHEWABLE_TABLET | ORAL | Status: AC
  Administered 2016-08-11: 81 mg via ORAL

## 2016-08-11 MED ORDER — SODIUM CHLORIDE 0.9% FLUSH
3.0000 mL | Freq: Two times a day (BID) | INTRAVENOUS | Status: DC
  Administered 2016-08-12: 3 mL via INTRAVENOUS

## 2016-08-11 MED ORDER — SODIUM CHLORIDE 0.9 % WEIGHT BASED INFUSION: 3.0000 mL/kg/h | INTRAVENOUS | Status: DC

## 2016-08-11 MED ORDER — MIDAZOLAM HCL 2 MG/2ML IJ SOLN
INTRAMUSCULAR | Status: AC
  Filled 2016-08-11: qty 2

## 2016-08-11 SURGICAL SUPPLY — 10 items
CATH 5FR PIGTAIL DIAGNOSTIC (CATHETERS) ×2 IMPLANT
CATH INFINITI 5FR JL4 (CATHETERS) ×2 IMPLANT
CATH INFINITI 5FR JL5 (CATHETERS) ×2 IMPLANT
CATH INFINITI JR4 5F (CATHETERS) ×2 IMPLANT
DEVICE CLOSURE MYNXGRIP 5F (Vascular Products) ×2 IMPLANT
KIT MANI 3VAL PERCEP (MISCELLANEOUS) ×2 IMPLANT
NEEDLE PERC 18GX7CM (NEEDLE) ×2 IMPLANT
PACK CARDIAC CATH (CUSTOM PROCEDURE TRAY) ×2 IMPLANT
SHEATH AVANTI 5FR X 11CM (SHEATH) ×2 IMPLANT
WIRE EMERALD 3MM-J .035X150CM (WIRE) ×2 IMPLANT

## 2016-08-11 NOTE — Progress Notes (Signed)
Pt has remained clinically stable post heart cath with no intervention.sr per monitor. Denies complaints chest pain, given zofran 4mg  iv for nausea but no intervention. Report called to Elpidio GaleaSusan Rn with plan reviewed. No bleeding nor hematoma at right groin site.

## 2016-08-11 NOTE — Progress Notes (Signed)
[facility] - [place] [facility]   PATIENT NAME: Melissa Haas    MR#:  [id number]  DATE OF BIRTH:  Mar 26, 1966  SUBJECTIVE:  CHIEF COMPLAINT:  Patient denies any shortness of breath is resting comfortably, has Port-A-Cath for IV iron transfusions  REVIEW OF SYSTEMS:  CONSTITUTIONAL: No fever, fatigue or weakness.  EYES: No blurred or double vision.  EARS, NOSE, AND THROAT: No tinnitus or ear pain.  RESPIRATORY: No cough, shortness of breath, wheezing or hemoptysis.  CARDIOVASCULAR: Reporting left-sided chest pain, denies orthopnea, edema.  GASTROINTESTINAL: No nausea, vomiting, diarrhea or abdominal pain.  GENITOURINARY: No dysuria, hematuria.  ENDOCRINE: No polyuria, nocturia,  HEMATOLOGY: No anemia, easy bruising or bleeding SKIN: No rash or lesion. MUSCULOSKELETAL: No joint pain or arthritis.   NEUROLOGIC: No tingling, numbness, weakness.  PSYCHIATRY: No anxiety or depression.   DRUG ALLERGIES:   Allergies  Allergen Reactions  . Flagyl [Metronidazole] Anaphylaxis  . Lisinopril Cough  . Morphine And Related Other (See Comments)    Causes me "chest pain"  . Toradol [Ketorolac Tromethamine] Other (See Comments)    Involuntary muscle movements  . Vicodin [Hydrocodone-Acetaminophen] Nausea And Vomiting    VITALS:  Blood pressure (!) 153/98, pulse 79, temperature 98.2 F (36.8 C), temperature source Oral, resp. rate 16, height 5\' 10"  (1.778 m), weight 112.9 kg (249 lb), SpO2 100 %.  PHYSICAL EXAMINATION:  GENERAL:  51 y.o.-year-old patient lying in the bed with no acute distress.  EYES: Pupils equal, round, reactive to light and accommodation. No scleral icterus. Extraocular muscles intact.  HEENT: Head atraumatic, normocephalic. Oropharynx and nasopharynx clear.  NECK:  Supple, no jugular venous distention. No thyroid enlargement, no tenderness.  LUNGS: Normal breath sounds bilaterally, no wheezing, rales,rhonchi or crepitation. No use of  accessory muscles of respiration.  CARDIOVASCULAR: S1, S2 normal. No murmurs, rubs, or gallops. Reproducible left-sided chest wall tenderness Port-A-Cath site is intact ABDOMEN: Soft, nontender, nondistended. Bowel sounds present. No organomegaly or mass.  EXTREMITIES: No pedal edema, cyanosis, or clubbing.  NEUROLOGIC: Cranial nerves II through XII are intact. Muscle strength 5/5 in all extremities. Sensation intact. Gait not checked.  PSYCHIATRIC: The patient is alert and oriented x 3.  SKIN: No obvious rash, lesion, or ulcer.    LABORATORY PANEL:   CBC  Recent Labs Lab 08/09/16 2350  WBC 7.6  HGB 11.0*  HCT 33.9*  PLT 178   ------------------------------------------------------------------------------------------------------------------  Chemistries   Recent Labs Lab 08/09/16 2350  NA 140  K 3.5  CL 108  CO2 24  GLUCOSE 109*  BUN 21*  CREATININE 1.18*  CALCIUM 9.0  AST 30  ALT 27  ALKPHOS 72  BILITOT 0.5   ------------------------------------------------------------------------------------------------------------------  Cardiac Enzymes  Recent Labs Lab 08/10/16 0652  TROPONINI <0.03   ------------------------------------------------------------------------------------------------------------------  RADIOLOGY:  Dg Chest 2 View  Result Date: 08/09/2016 CLINICAL DATA:  Central left-sided chest pain EXAM: CHEST  2 VIEW COMPARISON:  None. FINDINGS: The heart size and mediastinal contours are within normal limits. Both lungs are clear. No acute nor suspicious osseous abnormalities. There is straightening of thoracic spine. Cholecystectomy clips are seen in the upper abdomen. Port catheter tip terminates in the distal SVC. IMPRESSION: No active cardiopulmonary disease. Electronically Signed   By: Tollie Eth M.D.   On: 08/09/2016 23:36   Ct Angio Chest Pe W And/or Wo Contrast  Result Date: 08/10/2016 CLINICAL DATA:  Acute onset of central and left-sided chest  pain. Vomiting and shortness of breath. Left leg swelling  after recent meniscal repair. Initial encounter. EXAM: CT ANGIOGRAPHY CHEST WITH CONTRAST TECHNIQUE: Multidetector CT imaging of the chest was performed using the standard protocol during bolus administration of intravenous contrast. Multiplanar CT image reconstructions and MIPs were obtained to evaluate the vascular anatomy. CONTRAST:  75 mL of Isovue 370 IV contrast COMPARISON:  Chest radiograph performed 08/09/2016 FINDINGS: Cardiovascular:  There is no evidence of pulmonary embolus. The heart is normal in size. The thoracic aorta is unremarkable. No calcific atherosclerotic disease is seen. The great vessels are grossly unremarkable. Mediastinum/Nodes: The mediastinum is unremarkable in appearance. No mediastinal lymphadenopathy is seen. No pericardial effusion is identified. The visualized portions of the thyroid gland are unremarkable. A right-sided chest port is noted ending about the distal SVC. Lungs/Pleura: Minimal bilateral atelectasis is noted. No pleural effusion or pneumothorax is seen. No masses are identified. Upper Abdomen: The visualized portions of the liver and spleen are unremarkable. The patient is status post cholecystectomy, with clips noted at the gallbladder fossa. The visualized portions of the pancreas, adrenal glands and kidneys are within normal limits. Musculoskeletal: No acute osseous abnormalities are identified. The visualized musculature is unremarkable in appearance. Review of the MIP images confirms the above findings. IMPRESSION: 1. No evidence of pulmonary embolus. 2. Minimal bilateral atelectasis noted.  Lungs otherwise clear. Electronically Signed   By: Roanna RaiderJeffery  Chang M.D.   On: 08/10/2016 02:39    EKG:   Orders placed or performed during the hospital encounter of 08/09/16  . ED EKG within 10 minutes  . ED EKG within 10 minutes  . EKG 12-Lead  . EKG 12-Lead    ASSESSMENT AND PLAN:   This is a 51 year old  female admitted for chest pain.  1. Chest pain: Atypical Acute MI ruled out with negative troponins CT angiogram of the chest is negative for pulmonary embolism  cardiac catheterization done 3/28, nml results , pt prefers going home tomorrow   EKG shows downgoing ST segments and inferior leads. [facility] cardiology Dr. Juliann ParesalLwood Continue home medication Coreg and hold off on losartan as blood pressure is soft  2. Coronary artery disease: Continue aspirin. Continue Coreg holding losartan  3. Anemia: The patient receives regular IV iron infusions. Hemoglobin is acceptable at this time and setting of coronary artery disease.  4. Hyperlipidemia: Continue statin therapy  5. DVT prophylaxis: Lovenox  6. GI prophylaxis: None     All the records are reviewed and case discussed with Care Management/Social Workerr. Management plans discussed with the patient, family and they are in agreement.  CODE STATUS:   TOTAL TIME TAKING CARE OF THIS PATIENT: 35 minutes.   POSSIBLE D/C IN AM  DAYS, DEPENDING ON CLINICAL CONDITION.  Note: This dictation was prepared with Dragon dictation along with smaller phrase technology. Any transcriptional errors that result from this process are unintentional.   Ramonita LabGouru, Anant Agard M.D on 08/11/2016 at 7:26 PM  Between 7am to 6pm - Pager - [phone] After 6pm go to [email or web address] - password [email or web address] [facility]  [facility]  Office  [phone]  CC: Primary care physician; Danae ChenIMOTHY ANDREWS ASHLEY, MD

## 2016-08-11 NOTE — H&P (Signed)
Notified Dr. Winona LegatoVaickute of patient's complaints of pain. Oxycodone ordered.

## 2016-08-11 NOTE — Progress Notes (Signed)
Consent for cardiac cath signed.

## 2016-08-11 NOTE — Progress Notes (Signed)
Pt returned to room from specials recovery. Rt groin site clean; strong  pedal pulses. Diet ordered. painfree

## 2016-08-11 NOTE — Consult Note (Signed)
Reason for Consult: Unstable angina known PCI and stent Referring Physician: Dr Gouru  Melissa Haas is an 51 y.o. female.  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 Patient has rule out for myocardial infarction but continues to complain of severe chest discomfort which shortness of breath. Patient last had a cardiac catheter in February 2012 history as had mild to moderate coronary disease. Patient states to be compliant with medications doesn't smoke  Past Medical History:  Diagnosis Date  . Hypertension   . MI (myocardial infarction)   . Neuropathy (HCC)   . Renal disorder   . Sarcoidosis (HCC)     Past Surgical History:  Procedure Laterality Date  . CHOLECYSTECTOMY    . heart stent    . KNEE ARTHROSCOPY W/ MENISCAL REPAIR Left 08/05/2016  . KNEE SURGERY    . TUBAL LIGATION      Family History  Problem Relation Age of Onset  . Diabetes Mellitus II Mother   . CAD Sister     d. 54 yrs old  . Diabetes Mellitus II Sister     Social History:  reports that she has never smoked. She has never used smokeless tobacco. She reports that she does not drink alcohol or use drugs.  Allergies:  Allergies  Allergen Reactions  . Flagyl [Metronidazole] Anaphylaxis  . Lisinopril Cough  . Morphine And Related Other (See Comments)    Causes me "chest pain"  . Toradol [Ketorolac Tromethamine] Other (See Comments)    Involuntary muscle movements  . Vicodin [Hydrocodone-Acetaminophen] Nausea And Vomiting    Medications: I have reviewed the patient's current medications.  Results for orders placed or performed during the hospital encounter of 08/09/16 (from the past 48 hour(s))  Basic metabolic panel     Status: Abnormal   Collection Time: 08/09/16 11:50 PM  Result Value Ref Range    Sodium 140 135 - 145 mmol/L   Potassium 3.5 3.5 - 5.1 mmol/L   Chloride 108 101 - 111 mmol/L   CO2 24 22 - 32 mmol/L   Glucose, Bld 109 (H) 65 - 99 mg/dL   BUN 21 (H) 6 - 20 mg/dL   Creatinine, Ser 1.18 (H) 0.44 - 1.00 mg/dL   Calcium 9.0 8.9 - 10.3 mg/dL   GFR calc non Af Amer 53 (L) >60 mL/min   GFR calc Af Amer >60 >60 mL/min    Comment: (NOTE) The eGFR has been calculated using the CKD EPI equation. This calculation has not been validated in all clinical situations. eGFR's persistently <60 mL/min signify possible Chronic Kidney Disease.    Anion gap 8 5 - 15  CBC     Status: Abnormal   Collection Time: 08/09/16 11:50 PM  Result Value Ref Range   WBC 7.6 3.6 - 11.0 K/uL   RBC 4.09 3.80 - 5.20 MIL/uL   Hemoglobin 11.0 (L) 12.0 - 16.0 g/dL   HCT 33.9 (L) 35.0 - 47.0 %   MCV 82.9 80.0 - 100.0 fL   MCH 27.0 26.0 - 34.0 pg   MCHC 32.6 32.0 - 36.0 g/dL   RDW 14.3 11.5 - 14.5 %   Platelets 178 150 - 440 K/uL  Troponin I     Status: None   Collection Time: 08/09/16 11:50 PM  Result Value Ref Range   Troponin I <0.03 <0.03 ng/mL  Hepatic   function panel     Status: None   Collection Time: 08/09/16 11:50 PM  Result Value Ref Range   Total Protein 6.9 6.5 - 8.1 g/dL   Albumin 3.8 3.5 - 5.0 g/dL   AST 30 15 - 41 U/L   ALT 27 14 - 54 U/L   Alkaline Phosphatase 72 38 - 126 U/L   Total Bilirubin 0.5 0.3 - 1.2 mg/dL   Bilirubin, Direct 0.1 0.1 - 0.5 mg/dL   Indirect Bilirubin 0.4 0.3 - 0.9 mg/dL  Lipase, blood     Status: None   Collection Time: 08/09/16 11:50 PM  Result Value Ref Range   Lipase 22 11 - 51 U/L  Troponin I     Status: None   Collection Time: 08/10/16  3:47 AM  Result Value Ref Range   Troponin I <0.03 <0.03 ng/mL  HIV antibody (Routine Testing)     Status: None   Collection Time: 08/10/16  6:52 AM  Result Value Ref Range   HIV Screen 4th Generation wRfx Non Reactive Non Reactive    Comment: (NOTE) Performed At: [facility] [place], [place] [id number] Lindon Romp MD [id number]   TSH     Status: None   Collection Time: 08/10/16  6:52 AM  Result Value Ref Range   TSH 1.960 0.350 - 4.500 uIU/mL    Comment: Performed by a 3rd Generation assay with a functional sensitivity of <=0.01 uIU/mL.  Troponin I     Status: None   Collection Time: 08/10/16  6:52 AM  Result Value Ref Range   Troponin I <0.03 <0.03 ng/mL  Hemoglobin A1c     Status: Abnormal   Collection Time: 08/10/16  6:52 AM  Result Value Ref Range   Hgb A1c MFr Bld 6.1 (H) 4.8 - 5.6 %    Comment: (NOTE)         Pre-diabetes: 5.7 - 6.4         Diabetes: >6.4         Glycemic control for adults with diabetes: <7.0    Mean Plasma Glucose 128 mg/dL    Comment: (NOTE) Performed At: [facility] [address] [place], [place] [id number] Lindon Romp MD [id number]   Protime-INR     Status: None   Collection Time: 08/11/16  5:20 AM  Result Value Ref Range   Prothrombin Time 13.8 11.4 - 15.2 seconds   INR 1.06     Dg Chest 2 View  Result Date: 08/09/2016 CLINICAL DATA:  Central left-sided chest pain EXAM: CHEST  2 VIEW COMPARISON:  None. FINDINGS: The heart size and mediastinal contours are within normal limits. Both lungs are clear. No acute nor suspicious osseous abnormalities. There is straightening of thoracic spine. Cholecystectomy clips are seen in the upper abdomen. Port catheter tip terminates in the distal SVC. IMPRESSION: No active cardiopulmonary disease. Electronically Signed   By: Ashley Royalty M.D.   On: 08/09/2016 23:36   Ct Angio Chest Pe W And/or Wo Contrast  Result Date: 08/10/2016 CLINICAL DATA:  Acute onset of central and left-sided chest pain. Vomiting and shortness of breath. Left leg swelling after recent meniscal repair. Initial encounter. EXAM: CT ANGIOGRAPHY CHEST WITH CONTRAST TECHNIQUE: Multidetector CT imaging of the chest was performed using the standard protocol during bolus administration of  intravenous contrast. Multiplanar CT image reconstructions and MIPs were obtained to evaluate the vascular anatomy. CONTRAST:  75 mL of Isovue 370 IV contrast COMPARISON:  Chest radiograph  performed 08/09/2016 FINDINGS: Cardiovascular:  There is no evidence of pulmonary embolus. The heart is normal in size. The thoracic aorta is unremarkable. No calcific atherosclerotic disease is seen. The great vessels are grossly unremarkable. Mediastinum/Nodes: The mediastinum is unremarkable in appearance. No mediastinal lymphadenopathy is seen. No pericardial effusion is identified. The visualized portions of the thyroid gland are unremarkable. A right-sided chest port is noted ending about the distal SVC. Lungs/Pleura: Minimal bilateral atelectasis is noted. No pleural effusion or pneumothorax is seen. No masses are identified. Upper Abdomen: The visualized portions of the liver and spleen are unremarkable. The patient is status post cholecystectomy, with clips noted at the gallbladder fossa. The visualized portions of the pancreas, adrenal glands and kidneys are within normal limits. Musculoskeletal: No acute osseous abnormalities are identified. The visualized musculature is unremarkable in appearance. Review of the MIP images confirms the above findings. IMPRESSION: 1. No evidence of pulmonary embolus. 2. Minimal bilateral atelectasis noted.  Lungs otherwise clear. Electronically Signed   By: Garald Balding M.D.   On: 08/10/2016 02:39    Review of Systems  Constitutional: Positive for malaise/fatigue.  HENT: Positive for congestion.   Eyes: Negative.   Respiratory: Positive for cough and shortness of breath.   Cardiovascular: Positive for chest pain, palpitations, leg swelling and PND.  Gastrointestinal: Negative.   Genitourinary: Negative.   Musculoskeletal: Positive for myalgias.  Skin: Negative.   Neurological: Positive for weakness.  Endo/Heme/Allergies: Negative.   Psychiatric/Behavioral: Negative.     Blood pressure (!) 158/90, pulse 71, temperature 98.3 F (36.8 C), resp. rate 18, height 5' 10" (1.778 m), weight 112.9 kg (249 lb), SpO2 98 %. Physical Exam  Nursing note and vitals reviewed. Constitutional: She is oriented to person, place, and time. She appears well-developed and well-nourished.  HENT:  Head: Normocephalic and atraumatic.  Eyes: Conjunctivae and EOM are normal. Pupils are equal, round, and reactive to light.  Neck: Normal range of motion. Neck supple.  Cardiovascular: Normal rate and regular rhythm.   Murmur heard. Respiratory: Effort normal and breath sounds normal.  GI: Soft. Bowel sounds are normal.  Musculoskeletal: Normal range of motion.  Neurological: She is alert and oriented to person, place, and time. She has normal reflexes.  Skin: Skin is warm and dry.  Psychiatric: She has a normal mood and affect.    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 PLAN Agree with rule out for myocardial infarction Follow-up cardiac enzymes Echocardiogram will be as helpful for assessment of LV function Continue Lipitor for lipid management Topamax as needed for recurrent headaches Consider follow-up with nephrology for mild renal insufficiency stage 1 DVT prophylaxis with Lovenox Continue GI prophylaxis Continue IV iron therapy for anemia currently hemoglobin is  Acceptable Recommend cardiac catheter because of persistent recurrent anginal symptoms   Melissa Haas D Melissa Haas 08/11/2016, 5:15 PM

## 2016-08-11 NOTE — Plan of Care (Signed)
Problem: Pain Managment: Goal: General experience of comfort will improve Outcome: Progressing Still complaining of pain in chest and in knee.

## 2016-08-12 ENCOUNTER — Encounter: Payer: Self-pay | Admitting: Internal Medicine

## 2016-08-12 MED ORDER — ALPRAZOLAM 0.25 MG PO TABS: 0.2500 mg | ORAL_TABLET | Freq: Once | ORAL | Status: DC | PRN

## 2016-08-12 MED ORDER — ACETAMINOPHEN 325 MG PO TABS: 650.0000 mg | ORAL_TABLET | Freq: Four times a day (QID) | ORAL | Status: AC | PRN

## 2016-08-12 MED ORDER — IBUPROFEN 400 MG PO TABS
600.0000 mg | ORAL_TABLET | Freq: Once | ORAL | Status: AC
  Administered 2016-08-12: 600 mg via ORAL
  Filled 2016-08-12: qty 2

## 2016-08-12 MED ORDER — ALUM & MAG HYDROXIDE-SIMETH 200-200-20 MG/5ML PO SUSP
30.0000 mL | Freq: Four times a day (QID) | ORAL | Status: DC | PRN
  Administered 2016-08-12: 30 mL via ORAL
  Filled 2016-08-12: qty 30

## 2016-08-12 MED ORDER — HEPARIN SOD (PORK) LOCK FLUSH 100 UNIT/ML IV SOLN
500.0000 [IU] | Freq: Once | INTRAVENOUS | Status: DC | PRN
  Filled 2016-08-12: qty 5

## 2016-08-12 MED ORDER — ALUM & MAG HYDROXIDE-SIMETH 200-200-20 MG/5ML PO SUSP: 30.0000 mL | Freq: Four times a day (QID) | ORAL | 0 refills | Status: AC | PRN

## 2016-08-12 MED ORDER — OXYCODONE HCL 10 MG PO TABS: 10.0000 mg | ORAL_TABLET | ORAL | 0 refills | Status: DC | PRN

## 2016-08-12 NOTE — Discharge Summary (Signed)
[facility] - [place] at [facility]   PATIENT NAME: Melissa Haas    MR#:  [id number]  DATE OF BIRTH:  [birth year]  DATE OF ADMISSION:  08/09/2016 ADMITTING PHYSICIAN: Arnaldo Natal, MD  DATE OF DISCHARGE: 08/12/16 PRIMARY CARE PHYSICIAN: Danae Chen, MD    ADMISSION DIAGNOSIS:  Chest pain, unspecified type [R07.9]  DISCHARGE DIAGNOSIS:  Active Problems:   Atypical chest pain   SECONDARY DIAGNOSIS:   Past Medical History:  Diagnosis Date  . Hypertension   . MI (myocardial infarction)   . Neuropathy (HCC)   . Renal disorder   . Sarcoidosis [facility])     HOSPITAL COURSE:  HPI: The patient with past medical history of coronary artery disease status post MI with stent placement, hypertension and sarcoidosis presents to the emergency department complaining of chest pain. The pain began at rest and was like pressure in character over her left chest. It radiated into her neck and down her left arm. The patient complains of her left hand becoming tingly. She had associated dizziness as well as nausea and vomiting. Emesis was nonbloody and nonbilious. She received aspirin in route in her chest pain resolved. Notably, the patient underwent repair of her left knee meniscus 5 days ago. CTA of her chest in the emergency department was negative for pulmonary embolism. Two sets of troponins were negative as well but due to her risk factors emergency department staff called the hospitalist service for admission   Hospital course  1. Chest pain: Atypical Acute MI ruled out with negative troponins CT angiogram of the chest is negative for pulmonary embolism  cardiac catheterization done 3/28, nml results  Patient has musculoskeletal reproducible chest pain improved with oxycodone, will discharge home with by mouth oxycodone 15 tablets with no refills   EKG shows downgoing ST segments and inferior leads. Follow-up with [facility] cardiology Dr. Juliann Pares Continue home  medication Coreg and hold off on losartan as blood pressure is soft  2. Coronary artery disease: Continue aspirin. Continue Coreg holding losartan  3. Anemia: The patient receives regular IV iron infusions. Hemoglobin is acceptable at this time and setting of coronary artery disease.  4. Hyperlipidemia: Continue statin therapy  5. DVT prophylaxis: Lovenox  6. GI prophylaxis: None  DISCHARGE CONDITIONS:   Stable   CONSULTS OBTAINED:  Treatment Team:  Alwyn Pea, MD   PROCEDURES cardiac cath  08/11/2016 normal  DRUG ALLERGIES:   Allergies  Allergen Reactions  . Flagyl [Metronidazole] Anaphylaxis  . Lisinopril Cough  . Morphine And Related Other (See Comments)    Causes me "chest pain"  . Toradol [Ketorolac Tromethamine] Other (See Comments)    Involuntary muscle movements  . Vicodin [Hydrocodone-Acetaminophen] Nausea And Vomiting    DISCHARGE MEDICATIONS:   Current Discharge Medication List    START taking these medications   Details  acetaminophen (TYLENOL) 325 MG tablet Take 2 tablets (650 mg total) by mouth every 6 (six) hours as needed for mild pain or headache.    alum & mag hydroxide-simeth (MAALOX/MYLANTA) 200-200-20 MG/5ML suspension Take 30 mLs by mouth every 6 (six) hours as needed for indigestion or heartburn. Qty: 355 mL, Refills: 0      CONTINUE these medications which have CHANGED   Details  Oxycodone HCl 10 MG TABS Take 1 tablet (10 mg total) by mouth every 4 (four) hours as needed. Qty: 15 tablet, Refills: 0      CONTINUE these medications which have NOT CHANGED   Details  aspirin EC 81 MG tablet Take 81 mg by mouth daily.    atorvastatin (LIPITOR) 80 MG tablet Take 80 mg by mouth every evening.    carvedilol (COREG) 3.125 MG tablet Take 3.125 mg by mouth 2 (two) times daily with a meal.    chlorzoxazone (PARAFON) 500 MG tablet Take 500 mg by mouth 3 (three) times daily as needed for spasms.    cyanocobalamin (,VITAMIN B-12,)  1000 MCG/ML injection Inject 1,000 mcg into the muscle every 30 (thirty) days.    Fluocinolone Acetonide Body 0.01 % OIL Apply topically.    gabapentin (NEURONTIN) 100 MG capsule Take 100 mg by mouth 2 (two) times daily.    losartan (COZAAR) 25 MG tablet Take 25 mg by mouth daily.    topiramate (TOPAMAX) 50 MG tablet Take 50 mg by mouth every evening.         DISCHARGE INSTRUCTIONS:   1. Chest pain: Atypical Acute MI ruled out with negative troponins CT angiogram of the chest is negative for pulmonary embolism  cardiac catheterization done 3/28, nml results , pt prefers going home tomorrow   EKG shows downgoing ST segments and inferior leads. Follow-up with [facility] cardiology Dr. Juliann Pares Continue home medication Coreg and hold off on losartan as blood pressure is soft  2. Coronary artery disease: Continue aspirin. Continue Coreg holding losartan  3. Anemia: The patient receives regular IV iron infusions. Hemoglobin is acceptable at this time and setting of coronary artery disease.  4. Hyperlipidemia: Continue statin therapy  5. DVT prophylaxis: Lovenox  6. GI prophylaxis: None  DIET:  Cardiac diet  DISCHARGE CONDITION:  Stable  ACTIVITY:  Activity as tolerated  OXYGEN:  Home Oxygen: No.   Oxygen Delivery: room air  DISCHARGE LOCATION:  home   If you experience worsening of your admission symptoms, develop shortness of breath, life threatening emergency, suicidal or homicidal thoughts you must seek medical attention immediately by calling 911 or calling your MD immediately  if symptoms less severe.  You Must read complete instructions/literature along with all the possible adverse reactions/side effects for all the Medicines you take and that have been prescribed to you. Take any new Medicines after you have completely understood and accpet all the possible adverse reactions/side effects.   Please note  You were cared for by a hospitalist during your hospital  stay. If you have any questions about your discharge medications or the care you received while you were in the hospital after you are discharged, you can call the unit and asked to speak with the hospitalist on call if the hospitalist that took care of you is not available. Once you are discharged, your primary care physician will handle any further medical issues. Please note that NO REFILLS for any discharge medications will be authorized once you are discharged, as it is imperative that you return to your primary care physician (or establish a relationship with a primary care physician if you do not have one) for your aftercare needs so that they can reassess your need for medications and monitor your lab values.     Today  Chief Complaint  Patient presents with  . Chest Pain  . Dizziness  . Nausea   Patient's chest pain resolved after taking oxycodone  ROS:  CONSTITUTIONAL: Denies fevers, chills. Denies any fatigue, weakness.  EYES: Denies blurry vision, double vision, eye pain. EARS, NOSE, THROAT: Denies tinnitus, ear pain, hearing loss. RESPIRATORY: Denies cough, wheeze, shortness of breath.  CARDIOVASCULAR: Denies chest pain, palpitations,  edema.  GASTROINTESTINAL: Denies nausea, vomiting, diarrhea, abdominal pain. Denies bright red blood per rectum. GENITOURINARY: Denies dysuria, hematuria. ENDOCRINE: Denies nocturia or thyroid problems. HEMATOLOGIC AND LYMPHATIC: Denies easy bruising or bleeding. SKIN: Denies rash or lesion. MUSCULOSKELETAL: Denies pain in neck, back, shoulder, knees, hips or arthritic symptoms.  NEUROLOGIC: Denies paralysis, paresthesias.  PSYCHIATRIC: Denies anxiety or depressive symptoms.   VITAL SIGNS:  Blood pressure (!) 145/69, pulse 84, temperature 98.2 F (36.8 C), temperature source Oral, resp. rate 18, height 5\' 10"  (1.778 m), weight 113.4 kg (250 lb), SpO2 94 %.  I/O:    Intake/Output Summary (Last 24 hours) at 08/12/16 1156 Last data filed  at 08/12/16 1014  Gross per 24 hour  Intake              120 ml  Output              800 ml  Net             -680 ml    PHYSICAL EXAMINATION:  GENERAL:  51 y.o.-year-old patient lying in the bed with no acute distress.  EYES: Pupils equal, round, reactive to light and accommodation. No scleral icterus. Extraocular muscles intact.  HEENT: Head atraumatic, normocephalic. Oropharynx and nasopharynx clear.  NECK:  Supple, no jugular venous distention. No thyroid enlargement, no tenderness.  LUNGS: Normal breath sounds bilaterally, no wheezing, rales,rhonchi or crepitation. No use of accessory muscles of respiration.  CARDIOVASCULAR: S1, S2 normal. No murmurs, rubs, or gallops. Anterior chest wall is tender, reproducible chest pain ABDOMEN: Soft, non-tender, non-distended. Bowel sounds present. No organomegaly or mass.  EXTREMITIES: No pedal edema, cyanosis, or clubbing.  NEUROLOGIC: Cranial nerves II through XII are intact. Muscle strength 5/5 in all extremities. Sensation intact. Gait not checked.  PSYCHIATRIC: The patient is alert and oriented x 3.  SKIN: No obvious rash, lesion, or ulcer.   DATA REVIEW:   CBC  Recent Labs Lab 08/09/16 2350  WBC 7.6  HGB 11.0*  HCT 33.9*  PLT 178    Chemistries   Recent Labs Lab 08/09/16 2350  NA 140  K 3.5  CL 108  CO2 24  GLUCOSE 109*  BUN 21*  CREATININE 1.18*  CALCIUM 9.0  AST 30  ALT 27  ALKPHOS 72  BILITOT 0.5    Cardiac Enzymes  Recent Labs Lab 08/10/16 0652  TROPONINI <0.03    Microbiology Results  No results found for this or any previous visit.  RADIOLOGY:  Dg Chest 2 View  Result Date: 08/09/2016 CLINICAL DATA:  Central left-sided chest pain EXAM: CHEST  2 VIEW COMPARISON:  None. FINDINGS: The heart size and mediastinal contours are within normal limits. Both lungs are clear. No acute nor suspicious osseous abnormalities. There is straightening of thoracic spine. Cholecystectomy clips are seen in the upper  abdomen. Port catheter tip terminates in the distal SVC. IMPRESSION: No active cardiopulmonary disease. Electronically Signed   By: Tollie Ethavid  Kwon M.D.   On: 08/09/2016 23:36   Ct Angio Chest Pe W And/or Wo Contrast  Result Date: 08/10/2016 CLINICAL DATA:  Acute onset of central and left-sided chest pain. Vomiting and shortness of breath. Left leg swelling after recent meniscal repair. Initial encounter. EXAM: CT ANGIOGRAPHY CHEST WITH CONTRAST TECHNIQUE: Multidetector CT imaging of the chest was performed using the standard protocol during bolus administration of intravenous contrast. Multiplanar CT image reconstructions and MIPs were obtained to evaluate the vascular anatomy. CONTRAST:  75 mL of Isovue 370 IV contrast COMPARISON:  Chest radiograph performed 08/09/2016 FINDINGS: Cardiovascular:  There is no evidence of pulmonary embolus. The heart is normal in size. The thoracic aorta is unremarkable. No calcific atherosclerotic disease is seen. The great vessels are grossly unremarkable. Mediastinum/Nodes: The mediastinum is unremarkable in appearance. No mediastinal lymphadenopathy is seen. No pericardial effusion is identified. The visualized portions of the thyroid gland are unremarkable. A right-sided chest port is noted ending about the distal SVC. Lungs/Pleura: Minimal bilateral atelectasis is noted. No pleural effusion or pneumothorax is seen. No masses are identified. Upper Abdomen: The visualized portions of the liver and spleen are unremarkable. The patient is status post cholecystectomy, with clips noted at the gallbladder fossa. The visualized portions of the pancreas, adrenal glands and kidneys are within normal limits. Musculoskeletal: No acute osseous abnormalities are identified. The visualized musculature is unremarkable in appearance. Review of the MIP images confirms the above findings. IMPRESSION: 1. No evidence of pulmonary embolus. 2. Minimal bilateral atelectasis noted.  Lungs otherwise  clear. Electronically Signed   By: Roanna Raider M.D.   On: 08/10/2016 02:39    EKG:   Orders placed or performed during the hospital encounter of 08/09/16  . ED EKG within 10 minutes  . ED EKG within 10 minutes  . EKG 12-Lead  . EKG 12-Lead      Management plans discussed with the patient, family and they are in agreement.  CODE STATUS:     Code Status Orders        Start     Ordered   08/10/16 0602  Full code  Continuous     08/10/16 0601    Code Status History    Date Active Date Inactive Code Status Order ID Comments User Context   This patient has a current code status but no historical code status.      TOTAL TIME TAKING CARE OF THIS PATIENT: 45  minutes.   Note: This dictation was prepared with Dragon dictation along with smaller phrase technology. Any transcriptional errors that result from this process are unintentional.   @MEC @  on 08/12/2016 at 11:56 AM  Between 7am to 6pm - Pager - [phone]  After 6pm go to [email or web address] - password [email or web address] [facility]  [place] [facility]  Office  [phone]  CC: Primary care physician; Danae Chen, MD

## 2016-08-12 NOTE — Care Management (Signed)
No discharge needs identified by members of the care team 

## 2016-08-12 NOTE — Discharge Instructions (Signed)
1. Chest pain: Atypical Acute MI ruled out with negative troponins CT angiogram of the chest is negative for pulmonary embolism  cardiac catheterization done 3/28, nml results , pt prefers going home tomorrow   EKG shows downgoing ST segments and inferior leads. [facility] Dr. Juliann ParesalLwood Continue home medication Coreg and hold off on losartan as blood pressure is soft  2. Coronary artery disease: Continue aspirin. Continue Coreg holding losartan  3. Anemia: The patient receives regular IV iron infusions. Hemoglobin is acceptable at this time and setting of coronary artery disease.  4. Hyperlipidemia: Continue statin therapy  5. DVT prophylaxis: Lovenox  6. GI prophylaxis: None

## 2018-10-07 ENCOUNTER — Emergency Department (HOSPITAL_COMMUNITY): Payer: Medicare Other

## 2018-10-07 ENCOUNTER — Other Ambulatory Visit: Payer: Self-pay

## 2018-10-07 ENCOUNTER — Emergency Department (HOSPITAL_COMMUNITY)
Admission: EM | Admit: 2018-10-07 | Discharge: 2018-10-07 | Disposition: A | Discharge status: Home / Self Care | Payer: Medicare Other | Attending: Emergency Medicine | Admitting: Emergency Medicine

## 2018-10-07 DIAGNOSIS — I252 Old myocardial infarction: Secondary | ICD-10-CM | POA: Diagnosis not present

## 2018-10-07 DIAGNOSIS — R51 Headache: Secondary | ICD-10-CM | POA: Diagnosis not present

## 2018-10-07 DIAGNOSIS — I10 Essential (primary) hypertension: Secondary | ICD-10-CM | POA: Insufficient documentation

## 2018-10-07 DIAGNOSIS — Z955 Presence of coronary angioplasty implant and graft: Secondary | ICD-10-CM | POA: Insufficient documentation

## 2018-10-07 DIAGNOSIS — Z79899 Other long term (current) drug therapy: Secondary | ICD-10-CM | POA: Diagnosis not present

## 2018-10-07 DIAGNOSIS — Z7982 Long term (current) use of aspirin: Secondary | ICD-10-CM | POA: Insufficient documentation

## 2018-10-07 DIAGNOSIS — R519 Headache, unspecified: Secondary | ICD-10-CM

## 2018-10-07 MED ORDER — DEXAMETHASONE SODIUM PHOSPHATE 10 MG/ML IJ SOLN
10.0000 mg | Freq: Once | INTRAMUSCULAR | Status: AC
  Administered 2018-10-07: 10 mg via INTRAVENOUS
  Filled 2018-10-07: qty 1

## 2018-10-07 MED ORDER — DIPHENHYDRAMINE HCL 50 MG/ML IJ SOLN
25.0000 mg | Freq: Once | INTRAMUSCULAR | Status: AC
  Administered 2018-10-07: 25 mg via INTRAVENOUS
  Filled 2018-10-07: qty 1

## 2018-10-07 MED ORDER — SODIUM CHLORIDE 0.9 % IV BOLUS
1000.0000 mL | Freq: Once | INTRAVENOUS | Status: AC
  Administered 2018-10-07: 01:00:00 1000 mL via INTRAVENOUS

## 2018-10-07 MED ORDER — METOCLOPRAMIDE HCL 10 MG PO TABS: 10.0000 mg | ORAL_TABLET | Freq: Four times a day (QID) | ORAL | 0 refills | Status: AC | PRN

## 2018-10-07 MED ORDER — METOCLOPRAMIDE HCL 5 MG/ML IJ SOLN
10.0000 mg | Freq: Once | INTRAMUSCULAR | Status: AC
  Administered 2018-10-07: 10 mg via INTRAVENOUS
  Filled 2018-10-07: qty 2

## 2018-10-07 NOTE — ED Provider Notes (Signed)
[facility]-EMERGENCY DEPT Provider Note   CSN: [id number] Arrival date & time: 10/07/18  0002    History   Chief Complaint Chief Complaint  Patient presents with  . Headache    HPI Melissa Haas is a 53 y.o. female.   The history is provided by the patient.  Headache  She has history of sarcoidosis, hypertension, coronary artery disease and comes in because of a right-sided headache which started 3 days ago.  She saw a dermatologist and was started on minocycline and doxepin.  That night, she woke up with a throbbing right-sided headache which has been constant since then.  She rates pain at 9/10.  There is associated photophobia and nausea but no vomiting.  She denies any visual changes.  She denies fever, chills, sweats.  She denies weakness, numbness, tingling.  She has taken acetaminophen without relief.  Past Medical History:  Diagnosis Date  . Hypertension   . MI (myocardial infarction)   . Neuropathy (HCC)   . Renal disorder   . Sarcoidosis [facility](HCC)     Patient Active Problem List   Diagnosis Date Noted  . Atypical chest pain 08/10/2016    Past Surgical History:  Procedure Laterality Date  . CHOLECYSTECTOMY    . heart stent    . KNEE ARTHROSCOPY W/ MENISCAL REPAIR Left 08/05/2016  . KNEE SURGERY    . LEFT HEART CATH AND CORONARY ANGIOGRAPHY N/A 08/11/2016   Procedure: Left Heart Cath and Coronary Angiography and possible PCI;  Surgeon: Alwyn Peawayne D Callwood, MD;  Location: [facility] INVASIVE CV LAB;  Service: Cardiovascular;  Laterality: N/A;  . TUBAL LIGATION       OB History   No obstetric history on file.      Home Medications    Prior to Admission medications   Medication Sig Start Date End Date Taking? Authorizing Provider  acetaminophen (TYLENOL) 325 MG tablet Take 2 tablets (650 mg total) by mouth every 6 (six) hours as needed for mild pain or headache. 08/12/16   Gouru, Deanna ArtisAruna, MD  alum & mag hydroxide-simeth (MAALOX/MYLANTA) 200-200-20  MG/5ML suspension Take 30 mLs by mouth every 6 (six) hours as needed for indigestion or heartburn. 08/12/16   Ramonita LabGouru, Aruna, MD  aspirin EC 81 MG tablet Take 81 mg by mouth daily.    [provider]  atorvastatin (LIPITOR) 80 MG tablet Take 80 mg by mouth every evening.    [provider]  carvedilol (COREG) 3.125 MG tablet Take 3.125 mg by mouth 2 (two) times daily with a meal.    [provider]  chlorzoxazone (PARAFON) 500 MG tablet Take 500 mg by mouth 3 (three) times daily as needed for spasms.    [provider]  cyanocobalamin (,VITAMIN B-12,) 1000 MCG/ML injection Inject 1,000 mcg into the muscle every 30 (thirty) days.    [provider]  Fluocinolone Acetonide Body 0.01 % OIL Apply topically.    [provider]  gabapentin (NEURONTIN) 100 MG capsule Take 100 mg by mouth 2 (two) times daily.    [provider]  losartan (COZAAR) 25 MG tablet Take 25 mg by mouth daily.    [provider]  Oxycodone HCl 10 MG TABS Take 1 tablet (10 mg total) by mouth every 4 (four) hours as needed. 08/12/16   Ramonita LabGouru, Aruna, MD  topiramate (TOPAMAX) 50 MG tablet Take 50 mg by mouth every evening.    [provider]    Family History Family History  Problem Relation  Age of Onset  . Diabetes Mellitus II Mother   . CAD Sister        d. 28 yrs old  . Diabetes Mellitus II Sister     Social History Social History   Tobacco Use  . Smoking status: Never Smoker  . Smokeless tobacco: Never Used  Substance Use Topics  . Alcohol use: No  . Drug use: No     Allergies   Flagyl [metronidazole]; Lisinopril; Morphine and related; Toradol [ketorolac tromethamine]; and Vicodin [hydrocodone-acetaminophen]   Review of Systems Review of Systems  Neurological: Positive for headaches.  All other systems reviewed and are negative.    Physical Exam Updated Vital Signs BP (!) 168/96   Pulse 84   Temp 98 F (36.7 C) (Oral)    Resp 15   Ht 5\' 10"  (1.778 m)   Wt 120.2 kg   SpO2 100%   BMI 38.02 kg/m   Physical Exam Vitals signs and nursing note reviewed.    53 year old female, resting comfortably and in no acute distress. Vital signs are significant for elevated blood pressure. Oxygen saturation is 100%, which is normal. Head is normocephalic and atraumatic. PERRLA, EOMI. Oropharynx is clear.  Fundi show no evidence of papilledema and no hemorrhage or exudate.  There is tenderness to palpation over the right temporalis muscle and over the insertion of the right paracervical muscles. Neck is nontender and supple without adenopathy or JVD. Back is nontender and there is no CVA tenderness. Lungs are clear without rales, wheezes, or rhonchi. Chest is nontender. Heart has regular rate and rhythm without murmur. Abdomen is soft, flat, nontender without masses or hepatosplenomegaly and peristalsis is normoactive. Extremities have no cyanosis or edema, full range of motion is present. Skin is warm and dry. Neurologic: Mental status is normal, cranial nerves are intact, there are no motor or sensory deficits.  ED Treatments / Results   Radiology Ct Head Wo Contrast  Result Date: 10/07/2018 CLINICAL DATA:  Severe headaches EXAM: CT HEAD WITHOUT CONTRAST TECHNIQUE: Contiguous axial images were obtained from the base of the skull through the vertex without intravenous contrast. COMPARISON:  None. FINDINGS: Brain: No evidence of acute infarction, hemorrhage, hydrocephalus, extra-axial collection or mass lesion/mass effect. Vascular: No hyperdense vessel or unexpected calcification. Skull: Normal. Negative for fracture or focal lesion. Sinuses/Orbits: No acute finding. Other: None. IMPRESSION: Normal head CT Electronically Signed   By: Alcide Clever M.D.   On: 10/07/2018 01:39    Procedures Procedures  Medications Ordered in ED Medications  sodium chloride 0.9 % bolus 1,000 mL (1,000 mLs Intravenous New Bag/Given  10/07/18 0117)  metoCLOPramide (REGLAN) injection 10 mg (10 mg Intravenous Given 10/07/18 0117)  diphenhydrAMINE (BENADRYL) injection 25 mg (25 mg Intravenous Given 10/07/18 0117)  dexamethasone (DECADRON) injection 10 mg (10 mg Intravenous Given 10/07/18 0117)     Initial Impression / Assessment and Plan / ED Course  I have reviewed the triage vital signs and the nursing notes.  Pertinent labs & imaging results that were available during my care of the patient were reviewed by me and considered in my medical decision making (see chart for details).  Headache following starting doxepin and minocycline.  While both can cause headaches, this headache characteristics seem more consistent with muscle contraction headache.  Minocycline can cause pseudotumor cerebri, but onset would have been much slower than what was seen with this.  Consider migraine variant.  No red flags to suggest more serious pathology.  Will check CT  of head and give migraine cocktail of metoclopramide, diphenhydramine, normal saline, dexamethasone.  Old records are reviewed confirming recent dermatology visit with prescriptions for minocycline and doxepin.  Of note, patient's itching has improved with doxepin.   CT scan was normal.  She had excellent relief of her headache with above-noted treatment.  At this point, headache seems to be a muscle contraction headache, no red flags to suggest more serious pathology.  She is discharged with prescription for metoclopramide, advised to continue taking her minocycline and doxepin.  Follow-up with her PCP and with her dermatologist.  Final Clinical Impressions(s) / ED Diagnoses   Final diagnoses:  Bad headache    ED Discharge Orders         Ordered    metoCLOPramide (REGLAN) 10 MG tablet  Every 6 hours PRN     10/07/18 0228           Dione Booze, MD 10/07/18 [phone]

## 2018-10-07 NOTE — Discharge Instructions (Signed)
Take acetaminophen as needed.  Apply ice to your temple and your neck as needed.

## 2018-10-07 NOTE — ED Triage Notes (Signed)
Pt has complaints of headache on the right side.  Pt recently started on doxepin and minocycline and has been having headache and dizziness every since.

## 2018-10-07 NOTE — ED Notes (Signed)
Bed: WA06 Expected date:  Expected time:  Means of arrival:  Comments: EMS -53 y/o headache

## 2018-11-24 ENCOUNTER — Other Ambulatory Visit (HOSPITAL_BASED_OUTPATIENT_CLINIC_OR_DEPARTMENT_OTHER): Payer: Self-pay

## 2018-11-24 DIAGNOSIS — G4700 Insomnia, unspecified: Secondary | ICD-10-CM

## 2018-12-06 ENCOUNTER — Other Ambulatory Visit (HOSPITAL_COMMUNITY): Payer: Medicare Other

## 2018-12-06 ENCOUNTER — Other Ambulatory Visit: Payer: Self-pay | Admitting: Physical Medicine and Rehabilitation

## 2018-12-06 DIAGNOSIS — M5416 Radiculopathy, lumbar region: Secondary | ICD-10-CM

## 2018-12-09 ENCOUNTER — Encounter (HOSPITAL_BASED_OUTPATIENT_CLINIC_OR_DEPARTMENT_OTHER): Payer: Medicare Other

## 2018-12-18 ENCOUNTER — Other Ambulatory Visit (HOSPITAL_COMMUNITY): Payer: Medicare Other

## 2018-12-19 ENCOUNTER — Other Ambulatory Visit (HOSPITAL_COMMUNITY)
Admission: RE | Admit: 2018-12-19 | Discharge: 2018-12-19 | Disposition: A | Discharge status: Home / Self Care | Payer: Medicare Other | Source: Ambulatory Visit | Attending: Internal Medicine | Admitting: Internal Medicine

## 2018-12-19 DIAGNOSIS — Z01812 Encounter for preprocedural laboratory examination: Secondary | ICD-10-CM | POA: Diagnosis present

## 2018-12-19 DIAGNOSIS — Z20828 Contact with and (suspected) exposure to other viral communicable diseases: Secondary | ICD-10-CM | POA: Diagnosis not present

## 2018-12-19 LAB — SARS CORONAVIRUS 2 (TAT 6-24 HRS): SARS Coronavirus 2: NEGATIVE

## 2018-12-20 ENCOUNTER — Ambulatory Visit (HOSPITAL_BASED_OUTPATIENT_CLINIC_OR_DEPARTMENT_OTHER): Payer: Medicare Other | Attending: Physical Medicine and Rehabilitation | Admitting: Internal Medicine

## 2018-12-20 ENCOUNTER — Other Ambulatory Visit: Payer: Self-pay

## 2018-12-20 DIAGNOSIS — G4733 Obstructive sleep apnea (adult) (pediatric): Secondary | ICD-10-CM | POA: Diagnosis not present

## 2018-12-20 DIAGNOSIS — G4700 Insomnia, unspecified: Secondary | ICD-10-CM | POA: Insufficient documentation

## 2018-12-20 DIAGNOSIS — R0683 Snoring: Secondary | ICD-10-CM | POA: Insufficient documentation

## 2018-12-20 DIAGNOSIS — I493 Ventricular premature depolarization: Secondary | ICD-10-CM | POA: Insufficient documentation

## 2018-12-23 DIAGNOSIS — G4700 Insomnia, unspecified: Secondary | ICD-10-CM | POA: Diagnosis not present

## 2018-12-23 NOTE — Procedures (Signed)
    Patient Name: Melissa Haas, Melissa Haas Date: 12/20/2018 Gender: Female D.O.B: 10-26-1965 Age (years): 53 Referring Provider: Margaretha Sheffield Height (inches): 13 Interpreting Physician: Baird Lyons MD, ABSM Weight (lbs): 262 RPSGT: Laren Everts BMI: 38 MRN: [id number] Neck Size: 16.50  CLINICAL INFORMATION Sleep Study Type: NPSG Indication for sleep study: Fatigue, Hypertension, Morning Headaches, Obesity, Witnessed Apneas Epworth Sleepiness Score: 3  SLEEP STUDY TECHNIQUE As per the AASM Manual for the Scoring of Sleep and Associated Events v2.3 (April 2016) with a hypopnea requiring 4% desaturations.  The channels recorded and monitored were frontal, central and occipital EEG, electrooculogram (EOG), submentalis EMG (chin), nasal and oral airflow, thoracic and abdominal wall motion, anterior tibialis EMG, snore microphone, electrocardiogram, and pulse oximetry.  MEDICATIONS Medications self-administered by patient taken the night of the study : none reportede  SLEEP ARCHITECTURE The study was initiated at 11:03:10 PM and ended at 5:12:17 AM.  Sleep onset time was 63.9 minutes and the sleep efficiency was 73.8%%. The total sleep time was 272.5 minutes.  Stage REM latency was 64.5 minutes.  The patient spent 7.3%% of the night in stage N1 sleep, 76.1%% in stage N2 sleep, 0.0%% in stage N3 and 16.5% in REM.  Alpha intrusion was absent.  Supine sleep was 20.18%.  RESPIRATORY PARAMETERS The overall apnea/hypopnea index (AHI) was 18.7 per hour. There were 15 total apneas, including 1 obstructive, 14 central and 0 mixed apneas. There were 70 hypopneas and 30 RERAs.  The AHI during Stage REM sleep was 20.0 per hour.  AHI while supine was 26.2 per hour.  The mean oxygen saturation was 93.5%. The minimum SpO2 during sleep was 88.0%.  moderate snoring was noted during this study.  CARDIAC DATA The 2 lead EKG demonstrated sinus rhythm. The mean heart rate was 77.9 beats  per minute. Other EKG findings include: PVCs.  LEG MOVEMENT DATA The total PLMS were 0 with a resulting PLMS index of 0.0. Associated arousal with leg movement index was 0.0 .  IMPRESSIONS - Moderate obstructive sleep apnea occurred during this study (AHI = 18.7/h). - Insufficcent early events and sleep to meet protocol requiremnts for split CPAP titration. - No significant central sleep apnea occurred during this study (CAI = 3.1/h). - The patient had minimal oxygen desaturation during the study (Min O2 = 88.0%). Mean saturation was 93.5%. - The patient snored with moderate snoring volume. - EKG findings include PVCs. - Clinically significant periodic limb movements did not occur during sleep. No significant associated arousals.  DIAGNOSIS - Obstructive Sleep Apnea (327.23 [G47.33 ICD-10])  RECOMMENDATIONS - Suggest CPAP autopap or CPAP titration sleep study. Other options would be based on clinical judgment. - Be careful with alcohol, sedatives and other CNS depressants that may worsen sleep apnea and disrupt normal sleep architecture. - Sleep hygiene should be reviewed to assess factors that may improve sleep quality. - Weight management and regular exercise should be initiated or continued if appropriate.  [Electronically signed] 12/23/2018 12:27 PM  Baird Lyons MD, [place], American Board of Sleep Medicine   NPI: [id number]                         [place], [facility]  ELECTRONICALLY SIGNED ON:  12/23/2018, 12:23 PM [place] PH: [phone]   FX: [phone] [place]

## 2018-12-26 ENCOUNTER — Other Ambulatory Visit: Payer: Medicare Other

## 2019-04-02 ENCOUNTER — Encounter: Payer: Self-pay | Admitting: Pulmonary Disease

## 2019-04-02 ENCOUNTER — Other Ambulatory Visit: Payer: Self-pay

## 2019-04-02 ENCOUNTER — Ambulatory Visit (INDEPENDENT_AMBULATORY_CARE_PROVIDER_SITE_OTHER): Payer: Medicare Other | Admitting: Pulmonary Disease

## 2019-04-02 VITALS — BP 140/88 | HR 82 | Temp 97.6°F | Ht 69.88 in | Wt 259.8 lb

## 2019-04-02 DIAGNOSIS — E669 Obesity, unspecified: Secondary | ICD-10-CM

## 2019-04-02 DIAGNOSIS — G4730 Sleep apnea, unspecified: Secondary | ICD-10-CM | POA: Diagnosis not present

## 2019-04-02 DIAGNOSIS — G4733 Obstructive sleep apnea (adult) (pediatric): Secondary | ICD-10-CM | POA: Diagnosis not present

## 2019-04-02 NOTE — Patient Instructions (Signed)
Will arrange for CPAP set up Follow up in 2 months 

## 2019-04-02 NOTE — Progress Notes (Signed)
Melissa Haas, Critical Care, and Sleep Medicine  Chief Complaint  Patient presents with  . Sleep Apnea    Constitutional:  BP 140/88 (BP Location: Right Arm, Cuff Size: Large)   Pulse 82   Temp 97.6 F (36.4 C) (Temporal)   Ht 5' 9.88" (1.775 m) Comment: with shoes  Wt 259 lb 12.8 oz (117.8 kg)   SpO2 100%   BMI 37.40 kg/m   Past Medical History:  Anxiety, Depression, HTN, HLD, GERD, CAD s/p stent, Sarcoidosis, CKD 3, Diverticulosis, Anemia, Chronic pain  Brief Summary:  Melissa Haas is a 53 y.o. female with obstructive sleep apnea.  She has been followed in pain clinic.  Getting more headaches.  Tired all the time.  Can fall asleep while driving or using the bathroom.  Snores and wakes up feeling choked.  Can't sleep on her back.  Had sleep study in August and showed moderate OSA.  She goes to sleep at 10 pm.  She falls asleep in 30 minutes.  She wakes up several times to use the bathroom.  She gets out of bed at 8 am.  She feels tired in the morning.  She does not use anything to help her fall sleep or stay awake.  She denies sleep walking, sleep talking, bruxism, or nightmares.  There is no history of restless legs.  She denies sleep hallucinations, sleep paralysis, or cataplexy.  The Epworth score is 21 out of 24.  Her father and aunt both have OSA and use CPAP.    Physical Exam:   Appearance - well kempt   ENMT - clear nasal mucosa, midline nasal  septum, no oral exudates, no LAN, trachea midline, decreased AP diameter, low laying soft palate  Respiratory - normal chest wall, normal respiratory effort, no accessory muscle use, no wheeze/rales  CV - s1s2 regular rate and rhythm, no murmurs, no peripheral edema, radial pulses symmetric  GI - soft, non tender, no masses  Lymph - no adenopathy noted in neck and axillary areas  MSK - normal gait  Ext - no cyanosis, clubbing, or joint inflammation noted  Skin - chronic changes from sarcoid  Neuro - normal  strength, oriented x 3  Psych - normal mood and affect  Discussion:  She has snoring, sleep disruption, witnessed apnea, and daytime sleepiness.  She has history of HTN, and CAD.  Her sleep study showed moderate OSA.  Assessment/Plan:   Snoring with excessive daytime sleepiness from obstructive sleep apnea. 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 - discussed how weight can impact sleep and risk for sleep disordered breathing - discussed options to assist with weight loss: combination of diet modification, cardiovascular and strength training exercises  Cardiovascular risk. - had an extensive discussion regarding the adverse health consequences related to untreated sleep disordered breathing - specifically discussed the risks for hypertension, coronary artery disease, cardiac dysrhythmias, cerebrovascular disease, and diabetes - lifestyle modification discussed  Safe driving practices. - discussed how sleep disruption can increase risk of accidents, particularly when driving - safe driving practices were discussed   Patient Instructions  Will arrange for CPAP set up  Follow up in 2 months     Chesley Mires, MD [place] Pager: [phone] 04/02/2019, 9:56  AM  Flow Sheet     Chest imaging:  Ct angio chest 08/10/16 >> ATX  Sleep tests:  PSG 12/20/18 >> AHI 18.7, SpO2 low 88%  Cardiac tests:  LHC 08/11/16 >> patent stent in mid circumflex  Review of Systems:  DOE, Chest pain, HA, Anxiety, Feet swelling, Joint stiffness.  Remainder reviewed and negative.  Medications:   Allergies as of 04/02/2019      Reactions   Flagyl [metronidazole] Anaphylaxis    Lisinopril Cough   Morphine And Related Other (See Comments)   Causes me "chest pain"   Toradol [ketorolac Tromethamine] Other (See Comments)   Involuntary muscle movements   Vicodin [hydrocodone-acetaminophen] Nausea And Vomiting      Medication List       Accurate as of April 02, 2019  9:56 AM. If you have any questions, ask your nurse or doctor.        STOP taking these medications   chlorzoxazone 500 MG tablet Commonly known as: PARAFON Stopped by: Coralyn Helling, MD   gabapentin 100 MG capsule Commonly known as: NEURONTIN Stopped by: Coralyn Helling, MD     TAKE these medications   acetaminophen 325 MG tablet Commonly known as: TYLENOL Take 2 tablets (650 mg total) by mouth every 6 (six) hours as needed for mild pain or headache.   alum & mag hydroxide-simeth 200-200-20 MG/5ML suspension Commonly known as: MAALOX/MYLANTA Take 30 mLs by mouth every 6 (six) hours as needed for indigestion or heartburn.   aspirin EC 81 MG tablet Take 81 mg by mouth daily.   atorvastatin 80 MG tablet Commonly known as: LIPITOR Take 80 mg by mouth every evening.   carvedilol 3.125 MG tablet Commonly known as: COREG Take 3.125 mg by mouth 2 (two) times daily with a meal.   cyanocobalamin 1000 MCG/ML injection Commonly known as: (VITAMIN B-12) Inject 1,000 mcg into the muscle every 30 (thirty) days.   Fluocinolone Acetonide Body 0.01 % Oil Apply topically.   losartan 25 MG tablet Commonly known as: COZAAR Take 25 mg by mouth daily.   metoCLOPramide 10 MG tablet Commonly known as: REGLAN Take 1 tablet (10 mg total) by mouth every 6 (six) hours as needed for nausea (or headache).   Oxycodone HCl 10 MG Tabs Take 1 tablet (10 mg total) by mouth every 4 (four) hours as needed.   topiramate 50 MG tablet Commonly known as: TOPAMAX Take 50 mg by mouth every evening.       Past Surgical History:  She  has a past surgical history that includes heart stent; Cholecystectomy;  Tubal ligation; Knee surgery; Knee arthroscopy w/ meniscal repair (Left, 08/05/2016); and LEFT HEART CATH AND CORONARY ANGIOGRAPHY (N/A, 08/11/2016).  Family History:  Her family history includes CAD in her sister; Diabetes Mellitus II in her mother and sister.  Social History:  She  reports that she has never smoked. She has never used smokeless tobacco. She reports that she does not drink alcohol or use drugs.

## 2019-05-11 ENCOUNTER — Emergency Department (HOSPITAL_BASED_OUTPATIENT_CLINIC_OR_DEPARTMENT_OTHER)
Admission: EM | Admit: 2019-05-11 | Discharge: 2019-05-11 | Disposition: A | Discharge status: Home / Self Care | Payer: Medicare Other | Attending: Emergency Medicine | Admitting: Emergency Medicine

## 2019-05-11 ENCOUNTER — Emergency Department (HOSPITAL_BASED_OUTPATIENT_CLINIC_OR_DEPARTMENT_OTHER): Payer: Medicare Other

## 2019-05-11 ENCOUNTER — Other Ambulatory Visit: Payer: Self-pay

## 2019-05-11 ENCOUNTER — Encounter (HOSPITAL_BASED_OUTPATIENT_CLINIC_OR_DEPARTMENT_OTHER): Payer: Self-pay | Admitting: Emergency Medicine

## 2019-05-11 DIAGNOSIS — Y999 Unspecified external cause status: Secondary | ICD-10-CM | POA: Diagnosis not present

## 2019-05-11 DIAGNOSIS — S52351A Displaced comminuted fracture of shaft of radius, right arm, initial encounter for closed fracture: Secondary | ICD-10-CM | POA: Diagnosis not present

## 2019-05-11 DIAGNOSIS — W1811XA Fall from or off toilet without subsequent striking against object, initial encounter: Secondary | ICD-10-CM | POA: Diagnosis not present

## 2019-05-11 DIAGNOSIS — I10 Essential (primary) hypertension: Secondary | ICD-10-CM | POA: Diagnosis not present

## 2019-05-11 DIAGNOSIS — Z79899 Other long term (current) drug therapy: Secondary | ICD-10-CM | POA: Insufficient documentation

## 2019-05-11 DIAGNOSIS — Y9389 Activity, other specified: Secondary | ICD-10-CM | POA: Insufficient documentation

## 2019-05-11 DIAGNOSIS — S6981XA Other specified injuries of right wrist, hand and finger(s), initial encounter: Secondary | ICD-10-CM | POA: Diagnosis present

## 2019-05-11 DIAGNOSIS — Z7982 Long term (current) use of aspirin: Secondary | ICD-10-CM | POA: Insufficient documentation

## 2019-05-11 DIAGNOSIS — I252 Old myocardial infarction: Secondary | ICD-10-CM | POA: Insufficient documentation

## 2019-05-11 DIAGNOSIS — Y92002 Bathroom of unspecified non-institutional (private) residence single-family (private) house as the place of occurrence of the external cause: Secondary | ICD-10-CM | POA: Insufficient documentation

## 2019-05-11 DIAGNOSIS — S52501A Unspecified fracture of the lower end of right radius, initial encounter for closed fracture: Secondary | ICD-10-CM

## 2019-05-11 MED ORDER — HYDROMORPHONE HCL 1 MG/ML IJ SOLN
2.0000 mg | Freq: Once | INTRAMUSCULAR | Status: AC
  Administered 2019-05-11: 2 mg via INTRAMUSCULAR
  Filled 2019-05-11: qty 2

## 2019-05-11 MED ORDER — PROMETHAZINE HCL 25 MG PO TABS: 25.0000 mg | ORAL_TABLET | Freq: Once | ORAL | Status: AC

## 2019-05-11 MED ORDER — PROMETHAZINE HCL 25 MG PO TABS
ORAL_TABLET | ORAL | Status: AC
  Administered 2019-05-11: 05:00:00 25 mg via ORAL
  Filled 2019-05-11: qty 1

## 2019-05-11 NOTE — ED Triage Notes (Signed)
Pt has fractured right wrist from fall x 2 weeks ago. Pt fell while using bathroom tonight and re-injured right wrist.

## 2019-05-11 NOTE — Discharge Instructions (Addendum)
Wear wrist splint as applied until followed up by orthopedics.  Ice for 20 minutes every 2 hours while awake for the next 2 days.  Continue your Percocet as previously prescribed as needed for pain.  Follow-up with hand surgery in the next few days.  The contact information for Dr. Burney Gauze has been provided in this discharge summary for you to call and make these arrangements.

## 2019-05-11 NOTE — ED Notes (Signed)
Pt states she has contacted [employer] for a ride home.

## 2019-05-11 NOTE — ED Notes (Signed)
Pt has ice on wrist on arrival.

## 2019-05-11 NOTE — ED Notes (Signed)
Pt requesting nausea medication. Pt states, "phenergan is what usually works for me." MD made aware.

## 2019-05-11 NOTE — ED Provider Notes (Signed)
[facility] EMERGENCY DEPARTMENT Provider Note   CSN: [id number] Arrival date & time: 05/11/19  4259     History Chief Complaint  Patient presents with  . Wrist Injury    Melissa Haas is a 53 y.o. female.  Patient is a 53 year old female with history of hypertension, sarcoidosis.  She presents today for evaluation of right wrist pain.  Patient slipped and fell 1 week ago when she slipped on ice and landed on an outstretched hand.  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  She was seen at the emergency room facility in [place] and diagnosed with a distal radius fracture.  She was placed in a splint and was to follow-up with orthopedics.  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  This evening she was going to the bathroom when she slipped off the toilet and reinjured her wrist.  She was brought here by EMS complaining of severe right wrist pain.  The history is provided by the patient.  Wrist Injury Location:  Wrist Pain details:    Quality:  Throbbing   Radiates to:  Does not radiate   Severity:  Severe   Onset quality:  Sudden   Duration:  1 hour   Timing:  Constant   Progression:  Unchanged Relieved by:  Nothing Worsened by:  Movement      Past Medical History:  Diagnosis Date  . Hypertension   . MI (myocardial infarction) (HCC)   . Neuropathy   . Renal disorder   . Sarcoidosis     Patient Active Problem List   Diagnosis Date Noted  . Insomnia 12/20/2018  . Atypical chest pain 08/10/2016    Past Surgical History:  Procedure Laterality Date  . CHOLECYSTECTOMY    . heart stent    . KNEE ARTHROSCOPY W/ MENISCAL REPAIR Left 08/05/2016  . KNEE SURGERY    . LEFT HEART CATH AND CORONARY ANGIOGRAPHY N/A 08/11/2016   Procedure: Left Heart Cath and Coronary Angiography and possible PCI;  Surgeon: Alwyn Pea, MD;  Location: [facility] INVASIVE CV LAB;  Service: Cardiovascular;   Laterality: N/A;  . TUBAL LIGATION       OB History   No obstetric history on file.     Family History  Problem Relation Age of Onset  . Diabetes Mellitus II Mother   . CAD Sister        d. 3 yrs old  . Diabetes Mellitus II Sister     Social History   Tobacco Use  . Smoking status: Never Smoker  . Smokeless tobacco: Never Used  Substance Use Topics  . Alcohol use: No  . Drug use: No    Home Medications Prior to Admission medications   Medication Sig Start Date End Date Taking? Authorizing Provider  acetaminophen (TYLENOL) 325 MG tablet Take 2 tablets (650 mg total) by mouth every 6 (six) hours as needed for mild pain or headache. 08/12/16   Gouru, Deanna Artis, MD  alum & mag hydroxide-simeth (MAALOX/MYLANTA) 200-200-20 MG/5ML suspension Take 30 mLs by mouth every 6 (six) hours as needed for indigestion or heartburn. 08/12/16   Ramonita Lab, MD  aspirin EC 81 MG tablet Take 81 mg by mouth daily.    [provider]  atorvastatin (LIPITOR) 80 MG tablet Take 80 mg by mouth every evening.    [provider]  carvedilol (COREG) 3.125 MG tablet Take 3.125 mg by mouth  2 (two) times daily with a meal.    [provider]  cyanocobalamin (,VITAMIN B-12,) 1000 MCG/ML injection Inject 1,000 mcg into the muscle every 30 (thirty) days.    [provider]  Fluocinolone Acetonide Body 0.01 % OIL Apply topically.    [provider]  losartan (COZAAR) 25 MG tablet Take 25 mg by mouth daily.    [provider]  metoCLOPramide (REGLAN) 10 MG tablet Take 1 tablet (10 mg total) by mouth every 6 (six) hours as needed for nausea (or headache). 7/78/24   Delora Fuel, MD  Oxycodone HCl 10 MG TABS Take 1 tablet (10 mg total) by mouth every 4 (four) hours as needed. 08/12/16   Nicholes Mango, MD  topiramate (TOPAMAX) 50 MG tablet Take 50 mg by mouth every evening.    [provider]    Allergies    Flagyl [metronidazole], Lisinopril, Morphine and  related, Toradol [ketorolac tromethamine], and Vicodin [hydrocodone-acetaminophen]  Review of Systems   Review of Systems  All other systems reviewed and are negative.   Physical Exam Updated Vital Signs BP (!) 146/95   Pulse 73   Temp 98 F (36.7 C) (Oral)   Resp 18   Ht 5\' 10"  (1.778 m)   Wt 118.4 kg   SpO2 97%   BMI 37.45 kg/m   Physical Exam Vitals and nursing note reviewed.  Constitutional:      General: She is not in acute distress.    Appearance: Normal appearance. She is not ill-appearing.  HENT:     Head: Normocephalic and atraumatic.  Pulmonary:     Effort: Pulmonary effort is normal.  Musculoskeletal:     Comments: The right wrist has swelling and deformity at the distal radius.  Patient able to flex, extend, oppose all fingers.  Sensation is intact to all fingers.  Capillary refill is brisk and ulnar and radial pulses are palpable.  Skin:    General: Skin is warm and dry.  Neurological:     Mental Status: She is alert and oriented to person, place, and time.     ED Results / Procedures / Treatments   Labs (all labs ordered are listed, but only abnormal results are displayed) Labs Reviewed - No data to display  EKG None  Radiology No results found.  Procedures Procedures (including critical care time)  Medications Ordered in ED Medications - No data to display  ED Course  I have reviewed the triage vital signs and the nursing notes.  Pertinent labs & imaging results that were available during my care of the patient were reviewed by me and considered in my medical decision making (see chart for details).    MDM Rules/Calculators/A&P  Patient presenting with complaints of severe right wrist pain.  She broke her wrist 1 week ago in [place] after slipping on ice.  She fell again this evening apparently slipping off of the toilet.  Patient has a comminuted, intra-articular distal radius fracture that appears to have perhaps displaced slightly  further than on previous films.  She is neurovascularly intact and there are no signs of compartment syndrome.  Patient will be given pain medication and placed in a sugar tong splint and sling.  She is to ice and follow-up with hand surgery in the very near future.  Final Clinical Impression(s) / ED Diagnoses Final diagnoses:  None    Rx / DC Orders ED Discharge Orders    None       Veryl Speak, MD  05/11/19 0431  

## 2019-06-15 ENCOUNTER — Encounter (HOSPITAL_BASED_OUTPATIENT_CLINIC_OR_DEPARTMENT_OTHER): Payer: Self-pay

## 2019-06-15 ENCOUNTER — Other Ambulatory Visit: Payer: Self-pay

## 2019-06-15 ENCOUNTER — Emergency Department (HOSPITAL_BASED_OUTPATIENT_CLINIC_OR_DEPARTMENT_OTHER): Payer: Refusal to Pay/Bad Debt

## 2019-06-15 ENCOUNTER — Emergency Department
Admission: EM | Admit: 2019-06-15 | Discharge: 2019-06-15 | Disposition: A | Discharge status: Home / Self Care | Payer: Refusal to Pay/Bad Debt | Source: Intra-hospital | Attending: Emergency Medicine | Admitting: Emergency Medicine

## 2019-06-15 DIAGNOSIS — S52501D Unspecified fracture of the lower end of right radius, subsequent encounter for closed fracture with routine healing: Secondary | ICD-10-CM | POA: Diagnosis not present

## 2019-06-15 DIAGNOSIS — M25531 Pain in right wrist: Secondary | ICD-10-CM

## 2019-06-15 DIAGNOSIS — D860 Sarcoidosis of lung: Secondary | ICD-10-CM | POA: Insufficient documentation

## 2019-06-15 HISTORY — DX: Sarcoidosis of lung: D86.0

## 2019-06-15 MED ORDER — ACETAMINOPHEN 325 MG PO TABS
650.00 mg | ORAL_TABLET | Freq: Once | ORAL | Status: AC
Start: 2019-06-15 — End: 2019-06-15
  Administered 2019-06-15: 650 mg via ORAL
  Filled 2019-06-15: qty 2

## 2019-06-15 MED ORDER — LORATADINE 10 MG PO TABS
10.00 mg | ORAL_TABLET | Freq: Once | ORAL | Status: AC
Start: 2019-06-15 — End: 2019-06-15
  Administered 2019-06-15: 10 mg via ORAL
  Filled 2019-06-15: qty 1

## 2019-06-15 NOTE — Narrator Note (Signed)
Spoke with Child psychotherapist, coming to speak with the patient.
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
## 2019-06-15 NOTE — Narrator Note (Signed)
Assumed care of this patient.  CM RN at bedside with patient at this time.

## 2019-06-15 NOTE — Narrator Note (Signed)
Meal tray and ginger ale given to patient. Patient denies any complaints at this time. No signs of distress. Respirations even and unlabored.
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
## 2019-06-15 NOTE — Narrator Note (Signed)
Patient Disposition  Patient education for diagnosis, medications, activity, diet and follow-up.  Patient left ED 1:03 PM.  Patient rep received written instructions.    Interpreter to provide instructions: No    Patient belongings with patient: YES    Have all existing LDAs been addressed? N/A    Have all IV infusions been stopped? N/A    Destination: Discharged to home. Patient provided with discharge instructions, teaching, Rx, and follow up care.  Patient verbalizes understanding of dc.  Pt skin warm, dry appropriate for ethnicity.  In no apparent distress, speaking in full sentences.  Patient leaving ED with cab voucher.
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
## 2019-06-15 NOTE — Progress Notes (Signed)
CASE MANAGEMENT SCREENING:   Risk factors::  Patient given cab voucher at [facility] request  Proceed to assessment:: No

## 2019-06-15 NOTE — Discharge Instructions (Addendum)
You were in the evaluated emergency department,    Social work saw you after discussion, you desired discharge,    You had an x-ray that shows no acute fracture of the wrist    Continue to monitor symptoms, if you feel unsafe in the community, or other severe concerning symptoms seek reevaluation the emergency department

## 2020-08-09 IMAGING — CR DG WRIST COMPLETE 3+V*R*
4 series · 4 of 4 positions shown · non-contrast
Comparison: None.

CLINICAL DATA: Fall tonight with right wrist injury.

EXAM:
RIGHT WRIST - COMPLETE 3+ VIEW

[x wrist pa right]
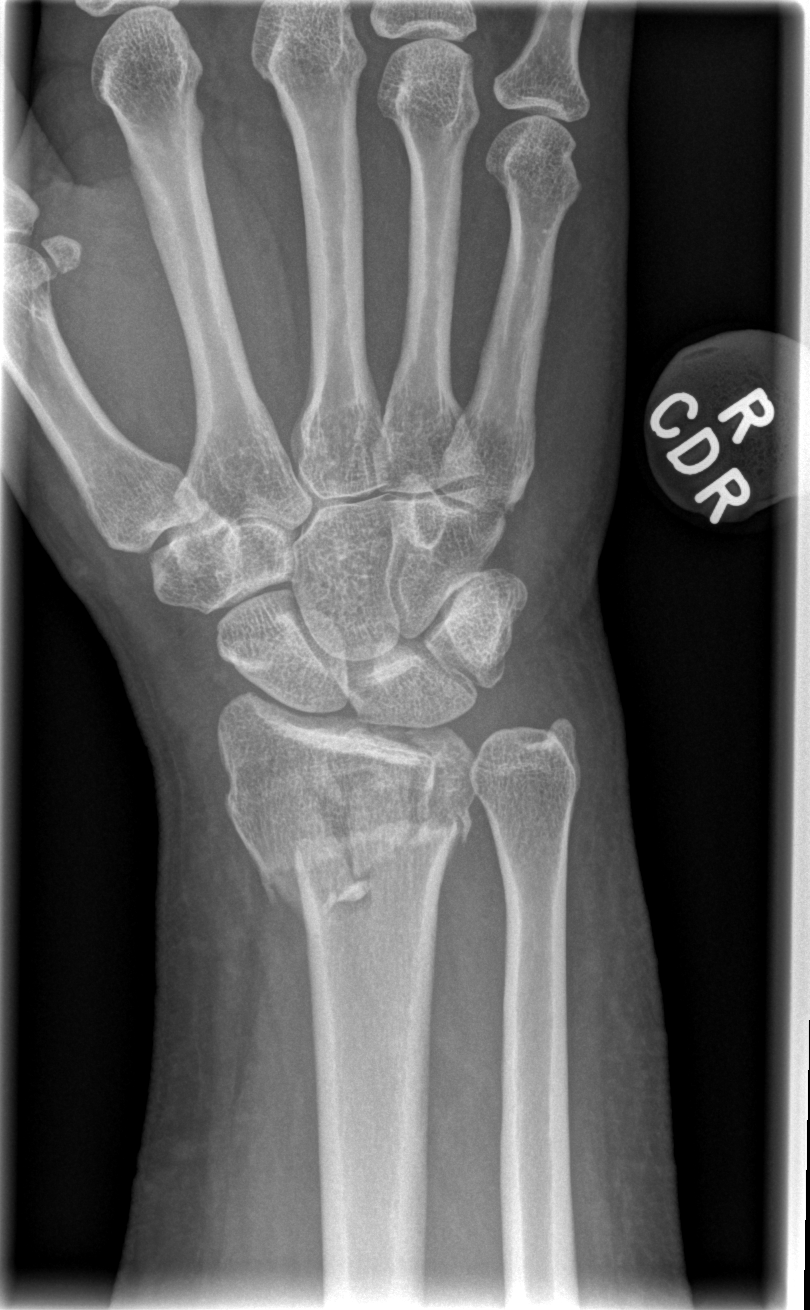

[x wrist obl right]
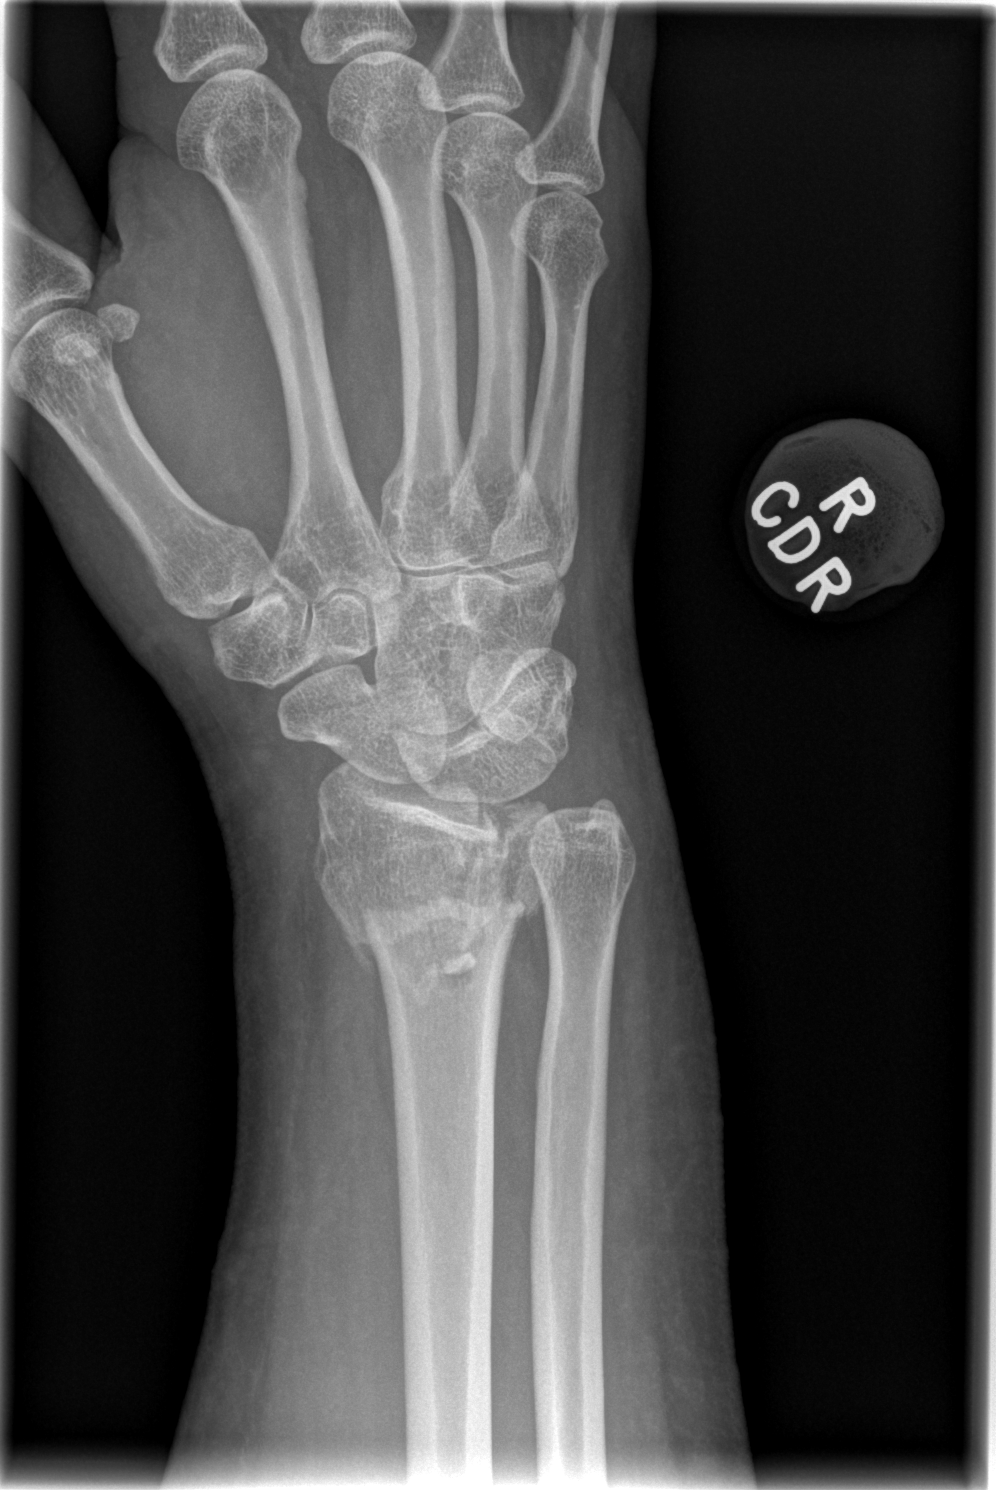

[x wrist lat right]
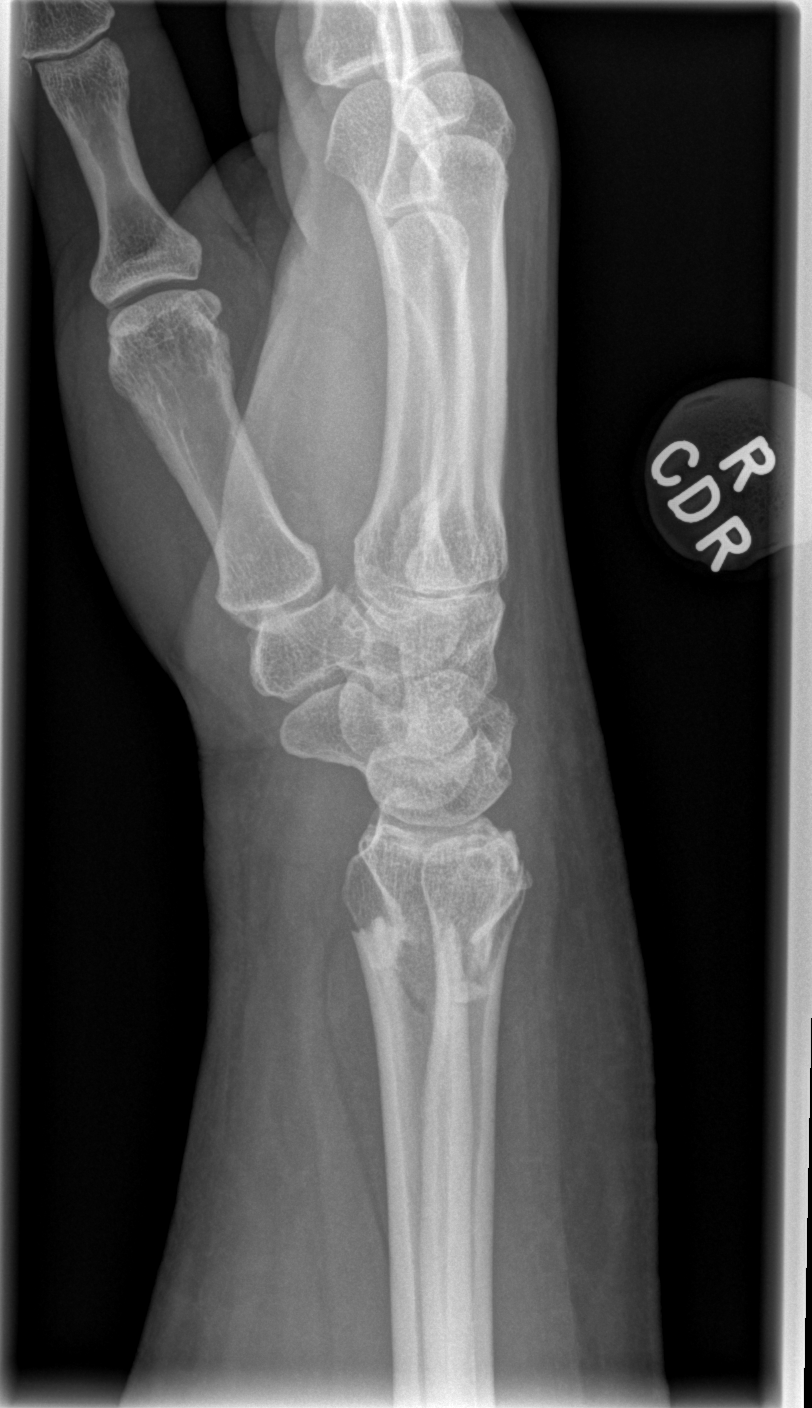

[x navicular]
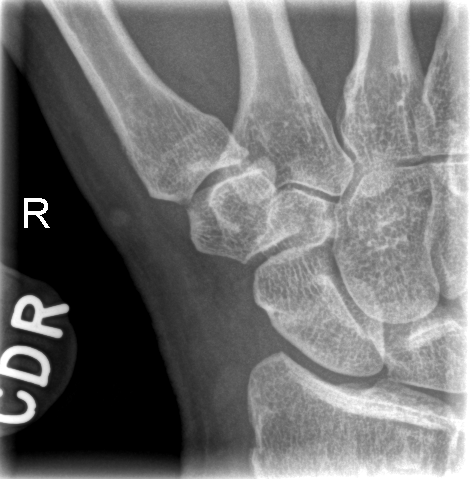

[4 of 4 positions shown; findings below may reference images not displayed]

FINDINGS: Examination demonstrates a displaced slightly comminuted fracture of
the distal radial metaphysis extending to the articular surface.
Remaining bones, joint spaces and soft tissues are unremarkable.
IMPRESSION: Displaced, slightly comminuted distal radial fracture.
